# Patient Record
Sex: Male | Born: 1955 | Race: White | Hispanic: No | Marital: Married | Smoking: Never smoker
Health system: Southern US, Community
[De-identification: ages and names within clinical notes are randomized; demographics above are authoritative.]

## PROBLEM LIST (undated history)

## (undated) DIAGNOSIS — Q2381 Bicuspid aortic valve: Secondary | ICD-10-CM

## (undated) DIAGNOSIS — Q231 Congenital insufficiency of aortic valve: Secondary | ICD-10-CM

## (undated) DIAGNOSIS — N200 Calculus of kidney: Secondary | ICD-10-CM

## (undated) DIAGNOSIS — R7303 Prediabetes: Secondary | ICD-10-CM

## (undated) DIAGNOSIS — R319 Hematuria, unspecified: Secondary | ICD-10-CM

## (undated) DIAGNOSIS — R351 Nocturia: Secondary | ICD-10-CM

## (undated) DIAGNOSIS — I480 Paroxysmal atrial fibrillation: Secondary | ICD-10-CM

## (undated) DIAGNOSIS — Z973 Presence of spectacles and contact lenses: Secondary | ICD-10-CM

## (undated) DIAGNOSIS — E782 Mixed hyperlipidemia: Secondary | ICD-10-CM

## (undated) DIAGNOSIS — M1990 Unspecified osteoarthritis, unspecified site: Secondary | ICD-10-CM

## (undated) DIAGNOSIS — M5090 Cervical disc disorder, unspecified, unspecified cervical region: Secondary | ICD-10-CM

## (undated) DIAGNOSIS — N400 Enlarged prostate without lower urinary tract symptoms: Secondary | ICD-10-CM

## (undated) DIAGNOSIS — R339 Retention of urine, unspecified: Secondary | ICD-10-CM

## (undated) DIAGNOSIS — Z87442 Personal history of urinary calculi: Secondary | ICD-10-CM

## (undated) DIAGNOSIS — I10 Essential (primary) hypertension: Secondary | ICD-10-CM

## (undated) DIAGNOSIS — N210 Calculus in bladder: Secondary | ICD-10-CM

## (undated) HISTORY — DX: Cervical disc disorder, unspecified, unspecified cervical region: M50.90

## (undated) HISTORY — DX: Calculus of kidney: N20.0

## (undated) HISTORY — DX: Bicuspid aortic valve: Q23.81

## (undated) HISTORY — PX: INGUINAL HERNIA REPAIR: SUR1180

## (undated) HISTORY — DX: Congenital insufficiency of aortic valve: Q23.1

## (undated) HISTORY — PX: COLONOSCOPY: SHX174

---
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
## 2005-01-10 ENCOUNTER — Ambulatory Visit: Payer: Self-pay | Admitting: Family Medicine

## 2016-03-21 DIAGNOSIS — Z13220 Encounter for screening for lipoid disorders: Secondary | ICD-10-CM | POA: Insufficient documentation

## 2016-03-21 DIAGNOSIS — N200 Calculus of kidney: Secondary | ICD-10-CM | POA: Insufficient documentation

## 2016-03-21 DIAGNOSIS — R7303 Prediabetes: Secondary | ICD-10-CM | POA: Insufficient documentation

## 2016-03-21 DIAGNOSIS — Z0000 Encounter for general adult medical examination without abnormal findings: Secondary | ICD-10-CM

## 2016-03-21 HISTORY — DX: Prediabetes: R73.03

## 2016-03-21 HISTORY — DX: Encounter for screening for lipoid disorders: Z13.220

## 2016-03-21 HISTORY — DX: Encounter for general adult medical examination without abnormal findings: Z00.00

## 2018-03-25 DIAGNOSIS — R310 Gross hematuria: Secondary | ICD-10-CM

## 2018-03-25 HISTORY — DX: Gross hematuria: R31.0

## 2018-12-24 ENCOUNTER — Other Ambulatory Visit: Payer: Self-pay | Admitting: Urology

## 2019-01-11 ENCOUNTER — Other Ambulatory Visit (HOSPITAL_COMMUNITY)
Admission: RE | Admit: 2019-01-11 | Discharge: 2019-01-11 | Disposition: A | Discharge status: Home / Self Care | Payer: BC Managed Care – PPO | Source: Ambulatory Visit | Attending: Urology | Admitting: Urology

## 2019-01-11 DIAGNOSIS — Z20828 Contact with and (suspected) exposure to other viral communicable diseases: Secondary | ICD-10-CM | POA: Insufficient documentation

## 2019-01-11 DIAGNOSIS — Z01812 Encounter for preprocedural laboratory examination: Secondary | ICD-10-CM | POA: Diagnosis present

## 2019-01-12 LAB — NOVEL CORONAVIRUS, NAA (HOSP ORDER, SEND-OUT TO REF LAB; TAT 18-24 HRS): SARS-CoV-2, NAA: NOT DETECTED

## 2019-01-14 ENCOUNTER — Encounter (HOSPITAL_BASED_OUTPATIENT_CLINIC_OR_DEPARTMENT_OTHER): Payer: Self-pay | Admitting: *Deleted

## 2019-01-14 ENCOUNTER — Other Ambulatory Visit: Payer: Self-pay

## 2019-01-14 NOTE — Progress Notes (Addendum)
ADDENDUM:  Received via fax from [facility];  cxr 07-17-2013,  Chest CT 03-11-2014,  EKG tracing 07-17-2013 and 07-18-2013, ECHO 07-18-2013,  Nuclear stress test 07-19-2013   Spoke w/ via phone for pre-op interview--- PT Lab needs dos----  Istat 8 and EKG COVID test ------ 01-11-2019 Arrive at ------- 1000 NPO after ------ [place] Medications to take morning of surgery ----- NONE Diabetic medication ----- n/a Patient Special Instructions ----- reviewed [facility] guidelines and visitor restriction policy ,  Pt asked to bring home medication with him dos.  Per pt as was told by someone in dr Tresa Moore office the he be allowed to stay and discharge until 1500 so his wife can work and came pick him up.  Pt verbalized understanding that doctor's are told and pt armade aware that discharge is 0900 next day  due to other pt's needing to stay the night, we have four rooms, stated he would make arrangements Pre-Op special Istructions ----- n/a  Patient verbalized understanding of instructions that were given at this phone interview. Patient denies cardiac s&s shortness of breath, chest pain, fever, cough, and no peripheral swelling at this phone interview.   Anesthesia Review:  Hx paf 2017 (I requested stess test , echo, and any other cardiac work-up done @ [facility] via fax);  ADDENDUM:  Received records via fax the date is 04/ 2015 not 2017  PCP:  Dr Herbie Baltimore dough (lov 03-25-2018 epic)per pt pcp follow his AFib also Cardiologist : No Chest x-ray : EKG : 09-15-2016 care everywhere Echo :  Per pt done 2017 at [place] test:  Per pt done 2017 at [place] :  NO Sleep Study/ CPAP : NO Fasting Blood Sugar :     NO  Blood Thinner/ Instructions /Last Dose: NO ASA / Instructions/ Last Dose :  ASA 81mg /  Per pt given instructions by dr Tresa Moore office to stop week prior to surgery/  Last dose 01-09-2019

## 2019-01-15 ENCOUNTER — Ambulatory Visit (HOSPITAL_BASED_OUTPATIENT_CLINIC_OR_DEPARTMENT_OTHER)
Admission: RE | Admit: 2019-01-15 | Discharge: 2019-01-16 | Disposition: A | Discharge status: Home / Self Care | Payer: BC Managed Care – PPO | Attending: Urology | Admitting: Urology

## 2019-01-15 ENCOUNTER — Other Ambulatory Visit: Payer: Self-pay

## 2019-01-15 ENCOUNTER — Encounter (HOSPITAL_BASED_OUTPATIENT_CLINIC_OR_DEPARTMENT_OTHER)
Admission: RE | Disposition: A | Discharge status: Home / Self Care | Payer: Self-pay | Source: Home / Self Care | Attending: Urology

## 2019-01-15 ENCOUNTER — Ambulatory Visit (HOSPITAL_BASED_OUTPATIENT_CLINIC_OR_DEPARTMENT_OTHER): Payer: BC Managed Care – PPO | Admitting: Anesthesiology

## 2019-01-15 ENCOUNTER — Encounter (HOSPITAL_BASED_OUTPATIENT_CLINIC_OR_DEPARTMENT_OTHER): Payer: Self-pay | Admitting: *Deleted

## 2019-01-15 DIAGNOSIS — I480 Paroxysmal atrial fibrillation: Secondary | ICD-10-CM | POA: Diagnosis not present

## 2019-01-15 DIAGNOSIS — N401 Enlarged prostate with lower urinary tract symptoms: Secondary | ICD-10-CM | POA: Diagnosis not present

## 2019-01-15 DIAGNOSIS — R351 Nocturia: Secondary | ICD-10-CM | POA: Insufficient documentation

## 2019-01-15 DIAGNOSIS — M19072 Primary osteoarthritis, left ankle and foot: Secondary | ICD-10-CM | POA: Insufficient documentation

## 2019-01-15 DIAGNOSIS — N400 Enlarged prostate without lower urinary tract symptoms: Secondary | ICD-10-CM | POA: Diagnosis present

## 2019-01-15 DIAGNOSIS — Z87442 Personal history of urinary calculi: Secondary | ICD-10-CM | POA: Insufficient documentation

## 2019-01-15 DIAGNOSIS — M19071 Primary osteoarthritis, right ankle and foot: Secondary | ICD-10-CM | POA: Diagnosis not present

## 2019-01-15 DIAGNOSIS — Z87891 Personal history of nicotine dependence: Secondary | ICD-10-CM | POA: Diagnosis not present

## 2019-01-15 DIAGNOSIS — R319 Hematuria, unspecified: Secondary | ICD-10-CM | POA: Diagnosis not present

## 2019-01-15 DIAGNOSIS — N138 Other obstructive and reflux uropathy: Secondary | ICD-10-CM | POA: Insufficient documentation

## 2019-01-15 DIAGNOSIS — Z7982 Long term (current) use of aspirin: Secondary | ICD-10-CM | POA: Insufficient documentation

## 2019-01-15 DIAGNOSIS — Z886 Allergy status to analgesic agent status: Secondary | ICD-10-CM | POA: Insufficient documentation

## 2019-01-15 DIAGNOSIS — R7303 Prediabetes: Secondary | ICD-10-CM | POA: Diagnosis not present

## 2019-01-15 DIAGNOSIS — N210 Calculus in bladder: Secondary | ICD-10-CM | POA: Insufficient documentation

## 2019-01-15 DIAGNOSIS — M47812 Spondylosis without myelopathy or radiculopathy, cervical region: Secondary | ICD-10-CM | POA: Insufficient documentation

## 2019-01-15 DIAGNOSIS — E782 Mixed hyperlipidemia: Secondary | ICD-10-CM | POA: Insufficient documentation

## 2019-01-15 DIAGNOSIS — I10 Essential (primary) hypertension: Secondary | ICD-10-CM | POA: Diagnosis not present

## 2019-01-15 DIAGNOSIS — Z79899 Other long term (current) drug therapy: Secondary | ICD-10-CM | POA: Insufficient documentation

## 2019-01-15 HISTORY — DX: Presence of spectacles and contact lenses: Z97.3

## 2019-01-15 HISTORY — PX: TRANSURETHRAL RESECTION OF PROSTATE: SHX73

## 2019-01-15 HISTORY — DX: Hematuria, unspecified: R31.9

## 2019-01-15 HISTORY — DX: Essential (primary) hypertension: I10

## 2019-01-15 HISTORY — DX: Paroxysmal atrial fibrillation: I48.0

## 2019-01-15 HISTORY — DX: Calculus in bladder: N21.0

## 2019-01-15 HISTORY — PX: CYSTOSCOPY WITH LITHOLAPAXY: SHX1425

## 2019-01-15 HISTORY — DX: Unspecified osteoarthritis, unspecified site: M19.90

## 2019-01-15 HISTORY — DX: Nocturia: R35.1

## 2019-01-15 HISTORY — DX: Personal history of urinary calculi: Z87.442

## 2019-01-15 HISTORY — DX: Mixed hyperlipidemia: E78.2

## 2019-01-15 HISTORY — DX: Benign prostatic hyperplasia without lower urinary tract symptoms: N40.0

## 2019-01-15 LAB — POCT I-STAT, CHEM 8
BUN: 18 mg/dL (ref 8–23)
Calcium, Ion: 1.37 mmol/L (ref 1.15–1.40)
Chloride: 103 mmol/L (ref 98–111)
Creatinine, Ser: 0.9 mg/dL (ref 0.61–1.24)
Glucose, Bld: 100 mg/dL — ABNORMAL HIGH (ref 70–99)
HCT: 48 % (ref 39.0–52.0)
Hemoglobin: 16.3 g/dL (ref 13.0–17.0)
Potassium: 3.6 mmol/L (ref 3.5–5.1)
Sodium: 142 mmol/L (ref 135–145)
TCO2: 25 mmol/L (ref 22–32)

## 2019-01-15 SURGERY — TURP (TRANSURETHRAL RESECTION OF PROSTATE)
Anesthesia: General

## 2019-01-15 MED ORDER — SODIUM CHLORIDE 0.9 % IV SOLN
INTRAVENOUS | Status: DC
  Administered 2019-01-15: 14:00:00 via INTRAVENOUS
  Filled 2019-01-15 (×2): qty 1000

## 2019-01-15 MED ORDER — EPHEDRINE 5 MG/ML INJ
INTRAVENOUS | Status: AC
  Filled 2019-01-15: qty 10

## 2019-01-15 MED ORDER — DEXAMETHASONE SODIUM PHOSPHATE 10 MG/ML IJ SOLN
INTRAMUSCULAR | Status: DC | PRN
  Administered 2019-01-15: 10 mg via INTRAVENOUS

## 2019-01-15 MED ORDER — SODIUM CHLORIDE 0.9 % IR SOLN
3000.0000 mL | Status: DC
  Administered 2019-01-15 – 2019-01-16 (×6): 3000 mL
  Filled 2019-01-15: qty 3000

## 2019-01-15 MED ORDER — MIDAZOLAM HCL 2 MG/2ML IJ SOLN
INTRAMUSCULAR | Status: AC
  Filled 2019-01-15: qty 2

## 2019-01-15 MED ORDER — PROMETHAZINE HCL 25 MG/ML IJ SOLN
6.2500 mg | INTRAMUSCULAR | Status: DC | PRN
  Filled 2019-01-15: qty 1

## 2019-01-15 MED ORDER — DEXAMETHASONE SODIUM PHOSPHATE 10 MG/ML IJ SOLN
INTRAMUSCULAR | Status: AC
  Filled 2019-01-15: qty 1

## 2019-01-15 MED ORDER — SENNOSIDES-DOCUSATE SODIUM 8.6-50 MG PO TABS
1.0000 | ORAL_TABLET | Freq: Two times a day (BID) | ORAL | Status: DC
  Administered 2019-01-15 (×2): 1 via ORAL
  Filled 2019-01-15 (×2): qty 3
  Filled 2019-01-15: qty 1

## 2019-01-15 MED ORDER — ACETAMINOPHEN 500 MG PO TABS
ORAL_TABLET | ORAL | Status: AC
  Filled 2019-01-15: qty 2

## 2019-01-15 MED ORDER — LIDOCAINE 2% (20 MG/ML) 5 ML SYRINGE
INTRAMUSCULAR | Status: DC | PRN
  Administered 2019-01-15: 80 mg via INTRAVENOUS

## 2019-01-15 MED ORDER — LACTATED RINGERS IV SOLN
INTRAVENOUS | Status: DC
  Administered 2019-01-15: 10:00:00 via INTRAVENOUS
  Filled 2019-01-15: qty 1000

## 2019-01-15 MED ORDER — LIDOCAINE 2% (20 MG/ML) 5 ML SYRINGE
INTRAMUSCULAR | Status: AC
  Filled 2019-01-15: qty 5

## 2019-01-15 MED ORDER — GENTAMICIN SULFATE 40 MG/ML IJ SOLN
5.0000 mg/kg | INTRAVENOUS | Status: AC
  Administered 2019-01-15: 440 mg via INTRAVENOUS
  Filled 2019-01-15: qty 11

## 2019-01-15 MED ORDER — ACETAMINOPHEN 500 MG PO TABS
1000.0000 mg | ORAL_TABLET | Freq: Three times a day (TID) | ORAL | Status: AC
  Administered 2019-01-15 – 2019-01-16 (×3): 1000 mg via ORAL
  Filled 2019-01-15: qty 2

## 2019-01-15 MED ORDER — OXYBUTYNIN CHLORIDE ER 10 MG PO TB24
10.0000 mg | ORAL_TABLET | Freq: Every day | ORAL | Status: DC
  Administered 2019-01-15: 10 mg via ORAL
  Filled 2019-01-15: qty 1

## 2019-01-15 MED ORDER — INDAPAMIDE 1.25 MG PO TABS
1.2500 mg | ORAL_TABLET | Freq: Every day | ORAL | Status: DC
  Administered 2019-01-15: 1.25 mg via ORAL
  Filled 2019-01-15: qty 1

## 2019-01-15 MED ORDER — PROPOFOL 10 MG/ML IV BOLUS
INTRAVENOUS | Status: DC | PRN
  Administered 2019-01-15: 180 mg via INTRAVENOUS

## 2019-01-15 MED ORDER — FENTANYL CITRATE (PF) 100 MCG/2ML IJ SOLN
INTRAMUSCULAR | Status: AC
  Filled 2019-01-15: qty 2

## 2019-01-15 MED ORDER — OXYBUTYNIN CHLORIDE ER 10 MG PO TB24
ORAL_TABLET | ORAL | Status: AC
  Filled 2019-01-15: qty 1

## 2019-01-15 MED ORDER — ONDANSETRON HCL 4 MG/2ML IJ SOLN
INTRAMUSCULAR | Status: DC | PRN
  Administered 2019-01-15: 4 mg via INTRAVENOUS

## 2019-01-15 MED ORDER — HYDROMORPHONE HCL 1 MG/ML IJ SOLN
0.5000 mg | INTRAMUSCULAR | Status: DC | PRN
  Filled 2019-01-15: qty 1

## 2019-01-15 MED ORDER — ACETAMINOPHEN 10 MG/ML IV SOLN
1000.0000 mg | Freq: Once | INTRAVENOUS | Status: DC | PRN
  Filled 2019-01-15: qty 100

## 2019-01-15 MED ORDER — ATORVASTATIN CALCIUM 10 MG PO TABS
10.0000 mg | ORAL_TABLET | Freq: Every day | ORAL | Status: DC
  Administered 2019-01-15: 10 mg via ORAL
  Filled 2019-01-15: qty 1

## 2019-01-15 MED ORDER — SENNA 8.6 MG PO TABS
ORAL_TABLET | ORAL | Status: AC
  Filled 2019-01-15: qty 1

## 2019-01-15 MED ORDER — ONDANSETRON HCL 4 MG/2ML IJ SOLN
INTRAMUSCULAR | Status: AC
  Filled 2019-01-15: qty 2

## 2019-01-15 MED ORDER — MIDAZOLAM HCL 5 MG/5ML IJ SOLN
INTRAMUSCULAR | Status: DC | PRN
  Administered 2019-01-15: 2 mg via INTRAVENOUS

## 2019-01-15 MED ORDER — METOPROLOL SUCCINATE ER 50 MG PO TB24
50.0000 mg | ORAL_TABLET | Freq: Every day | ORAL | Status: DC
  Administered 2019-01-15: 50 mg via ORAL
  Filled 2019-01-15: qty 1

## 2019-01-15 MED ORDER — PROPOFOL 10 MG/ML IV BOLUS
INTRAVENOUS | Status: AC
  Filled 2019-01-15: qty 20

## 2019-01-15 MED ORDER — FENTANYL CITRATE (PF) 100 MCG/2ML IJ SOLN
INTRAMUSCULAR | Status: DC | PRN
  Administered 2019-01-15 (×2): 50 ug via INTRAVENOUS

## 2019-01-15 MED ORDER — EPHEDRINE SULFATE-NACL 50-0.9 MG/10ML-% IV SOSY
PREFILLED_SYRINGE | INTRAVENOUS | Status: DC | PRN
  Administered 2019-01-15 (×3): 10 mg via INTRAVENOUS

## 2019-01-15 MED ORDER — OXYCODONE HCL 5 MG PO TABS
5.0000 mg | ORAL_TABLET | ORAL | Status: DC | PRN
  Filled 2019-01-15: qty 1

## 2019-01-15 MED ORDER — FENTANYL CITRATE (PF) 100 MCG/2ML IJ SOLN
25.0000 ug | INTRAMUSCULAR | Status: DC | PRN
  Filled 2019-01-15: qty 1

## 2019-01-15 SURGICAL SUPPLY — 37 items
BAG DRAIN URO-CYSTO SKYTR STRL (DRAIN) ×3 IMPLANT
BAG DRN UROCATH (DRAIN) ×1
BAG URINE DRAINAGE (UROLOGICAL SUPPLIES) ×3 IMPLANT
BAG URINE LEG 500ML (DRAIN) IMPLANT
BASKET LASER NITINOL 1.9FR (BASKET) IMPLANT
BSKT STON RTRVL 120 1.9FR (BASKET)
CATH FOLEY 2WAY SLVR 30CC 20FR (CATHETERS) IMPLANT
CATH FOLEY 3WAY 30CC 24FR (CATHETERS) ×3
CATH INTERMIT  6FR 70CM (CATHETERS) IMPLANT
CATH URTH STD 24FR FL 3W 2 (CATHETERS) IMPLANT
CLOTH BEACON ORANGE TIMEOUT ST (SAFETY) ×3 IMPLANT
ELECT BIVAP BIPO 22/24 DONUT (ELECTROSURGICAL)
ELECT REM PT RETURN 9FT ADLT (ELECTROSURGICAL)
ELECTRD BIVAP BIPO 22/24 DONUT (ELECTROSURGICAL) IMPLANT
ELECTRODE REM PT RTRN 9FT ADLT (ELECTROSURGICAL) IMPLANT
FIBER LASER FLEXIVA 365 (UROLOGICAL SUPPLIES) IMPLANT
FIBER LASER TRAC TIP (UROLOGICAL SUPPLIES) IMPLANT
GLOVE BIO SURGEON STRL SZ7.5 (GLOVE) ×3 IMPLANT
GOWN STRL REUS W/ TWL LRG LVL3 (GOWN DISPOSABLE) ×1 IMPLANT
GOWN STRL REUS W/TWL LRG LVL3 (GOWN DISPOSABLE) ×6 IMPLANT
GUIDEWIRE ANG ZIPWIRE 038X150 (WIRE) ×3 IMPLANT
GUIDEWIRE STR DUAL SENSOR (WIRE) ×3 IMPLANT
HOLDER FOLEY CATH W/STRAP (MISCELLANEOUS) ×3 IMPLANT
IV NS 1000ML (IV SOLUTION) ×3
IV NS 1000ML BAXH (IV SOLUTION) ×1 IMPLANT
IV NS IRRIG 3000ML ARTHROMATIC (IV SOLUTION) ×18 IMPLANT
KIT TURNOVER CYSTO (KITS) ×3 IMPLANT
LOOP CUT BIPOLAR 24F LRG (ELECTROSURGICAL) IMPLANT
MANIFOLD NEPTUNE II (INSTRUMENTS) ×3 IMPLANT
NS IRRIG 500ML POUR BTL (IV SOLUTION) ×6 IMPLANT
PACK CYSTO (CUSTOM PROCEDURE TRAY) ×3 IMPLANT
SYR 10ML LL (SYRINGE) ×3 IMPLANT
SYRINGE IRR TOOMEY STRL 70CC (SYRINGE) ×3 IMPLANT
TUBE CONNECTING 12'X1/4 (SUCTIONS) ×1
TUBE CONNECTING 12X1/4 (SUCTIONS) ×2 IMPLANT
TUBE FEEDING 8FR 16IN STR KANG (MISCELLANEOUS) IMPLANT
TUBING UROLOGY SET (TUBING) ×3 IMPLANT

## 2019-01-15 NOTE — Brief Op Note (Signed)
01/15/2019  1:00 PM  PATIENT:  Irving Copas  63 y.o. male  PRE-OPERATIVE DIAGNOSIS:  LARGE PROSTATE WITH BLADDER STONE  POST-OPERATIVE DIAGNOSIS:  LARGE PROSTATE WITH BLADDER STONE  PROCEDURE:  Procedure(s): TRANSURETHRAL RESECTION OF THE PROSTATE (TURP) (N/A) CYSTOSCOPY WITH LITHOLAPAXY (N/A)  SURGEON:  Surgeon(s) and Role:    * Alexis Frock, MD - Primary  PHYSICIAN ASSISTANT:   ASSISTANTS: none   ANESTHESIA:   general  EBL:  171mL   BLOOD ADMINISTERED:none  DRAINS: 3 way foley to NS irrigation   LOCAL MEDICATIONS USED:  NONE  SPECIMEN:  Source of Specimen:  1 - bladder stone fragments given to pt; 2 - prostate chips for permanant pathology  DISPOSITION OF SPECIMEN:  as per above  COUNTS:  YES  TOURNIQUET:  * No tourniquets in log *  DICTATION: .Other Dictation: Dictation Number [id number]  PLAN OF CARE: Admit for overnight observation  PATIENT DISPOSITION:  PACU - hemodynamically stable.   Delay start of Pharmacological VTE agent (>24hrs) due to surgical blood loss or risk of bleeding: yes

## 2019-01-15 NOTE — H&P (Signed)
[name] is an 63 y.o. male.    Chief Complaint: Pre-OP TURP + Cystolithalopexy  HPI:   1 - Gross Hematuria - long h/o occoasional hematuira, especially with activity. Long time stone former. Had cysto x 2 previously that was unremarkable per report. Non- smoker. No chronic solvent exposure. CT 05/2018 with multifocal stones.   2 - Urolithiasis - many episodes of medical passage of stones, up to 1mm. NO prior surgeries.  06/2018 - CT - punctate left renl stone, multifocal bladder stones   3  Enlarged Prostate with Bladder Stones - 56mL prostate with some median lobe by CT 2020 with about 3cm volume bladder stones on eval gross hematuria. PVR <33mL 2020.  PMH sig for pre-DM (A1c 6), AFib (transies, no blood thinners), Rt open inguinal hernia repair. 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 His PCP is Teressa Lower MD with [facility] [address] in [place].   Today " [name] " is seen to proceed with TURP and cystolithalopexy for very large prostate with bladder stones. C19 screen negative. Most recent UCX negative.    Past Medical History:  Diagnosis Date  . Arthritis    neck , ankles  . Bladder stone   . BPH (benign prostatic hyperplasia)   . Hematuria   . History of kidney stones   . Hypertension   . Mixed hyperlipidemia   . Nocturia   . PAF (paroxysmal atrial fibrillation) ([place]) followed by pcp-- per pt never followed by cardiologist after 2017   per pt had AFib 2017,  had work-up stress test and echo done at [facility] at that time, no other work-up done since,   . Pre-diabetes   . Wears glasses     Past Surgical History:  Procedure Laterality Date  . CYSTOSTOMY W/ RETROGRADE URETEROSCOPY  1994  . INGUINAL HERNIA REPAIR Right 2006  approx.    History reviewed. No pertinent family history. Social History:  reports that he has never smoked. He quit smokeless tobacco use about 45 years ago.  His smokeless tobacco use included snuff and  chew. He reports previous alcohol use. He reports that he does not use drugs.  Allergies:  Allergies  Allergen Reactions  . Ibuprofen Other (See Comments)    Mouth sores    No medications prior to admission.    No results found for this or any previous visit (from the past 48 hour(s)). No results found.  Review of Systems  Constitutional: Negative.   HENT: Negative.   Eyes: Negative.   Respiratory: Negative.   Cardiovascular: Negative.   Gastrointestinal: Negative.   Genitourinary: Positive for frequency, hematuria and urgency.  Musculoskeletal: Negative.   Skin: Negative.   Neurological: Negative.   Endo/Heme/Allergies: Negative.   Psychiatric/Behavioral: Negative.     Height 6\' 4"  (1.93 m), weight 88.5 kg. Physical Exam  Constitutional: He appears well-developed.  HENT:  Head: Normocephalic.  Eyes: Pupils are equal, round, and reactive to light.  Neck: Normal range of motion.  Cardiovascular: Normal rate.  Respiratory: Effort normal.  GI: Soft.  Genitourinary:    Genitourinary Comments: No CVAT.    Musculoskeletal: Normal range of motion.  Neurological: He is alert.  Skin: Skin is warm.  Psychiatric: He has a normal mood and affect.     Assessment/Plan  Proceed as planned with TURP / Cystolithalopexy. Risks, benefits, alternatives, expected peri-op couse discussed previously and reiterated today.   Alexis Frock, MD 01/15/2019, 8:15 AM

## 2019-01-15 NOTE — Anesthesia Procedure Notes (Signed)
Procedure Name: LMA Insertion Date/Time: 01/15/2019 11:52 AM Performed by: Stellarose Cerny D, CRNA Pre-anesthesia Checklist: Patient identified, Emergency Drugs available, Suction available and Patient being monitored Patient Re-evaluated:Patient Re-evaluated prior to induction Oxygen Delivery Method: Circle system utilized Preoxygenation: Pre-oxygenation with 100% oxygen Induction Type: IV induction Ventilation: Mask ventilation without difficulty LMA: LMA inserted LMA Size: 5.0 Tube type: Oral Number of attempts: 1 Placement Confirmation: positive ETCO2 and breath sounds checked- equal and bilateral Tube secured with: Tape Dental Injury: Teeth and Oropharynx as per pre-operative assessment

## 2019-01-15 NOTE — Transfer of Care (Signed)
Immediate Anesthesia Transfer of Care Note  Patient: [name]  Procedure(s) Performed: TRANSURETHRAL RESECTION OF THE PROSTATE (TURP) (N/A ) CYSTOSCOPY WITH LITHOLAPAXY (N/A )  Patient Location: PACU  Anesthesia Type:General  Level of Consciousness: awake, alert  and oriented  Airway & Oxygen Therapy: Patient Spontanous Breathing and Patient connected to nasal cannula oxygen  Post-op Assessment: Report given to RN and Post -op Vital signs reviewed and stable  Post vital signs: Reviewed and stable  Last Vitals:  Vitals Value Taken Time  BP 129/92 01/15/19 1310  Temp    Pulse 80 01/15/19 1312  Resp 13 01/15/19 1312  SpO2 99 % 01/15/19 1312  Vitals shown include unvalidated device data.  Last Pain:  Vitals:   01/15/19 1031  TempSrc: Oral  PainSc: 0-No pain      Patients Stated Pain Goal: 4 (16/94/50 3888)  Complications: No apparent anesthesia complications

## 2019-01-15 NOTE — Anesthesia Preprocedure Evaluation (Signed)
Anesthesia Evaluation  Patient identified by MRN, date of birth, ID band Patient awake    Reviewed: Allergy & Precautions, NPO status , Patient's Chart, lab work & pertinent test results  Airway Mallampati: II  TM Distance: >3 FB Neck ROM: Full    Dental no notable dental hx.    Pulmonary neg pulmonary ROS,    Pulmonary exam normal breath sounds clear to auscultation       Cardiovascular hypertension, Normal cardiovascular exam+ dysrhythmias Atrial Fibrillation  Rhythm:Regular Rate:Normal  PAF 2017   Neuro/Psych negative neurological ROS  negative psych ROS   GI/Hepatic negative GI ROS, Neg liver ROS,   Endo/Other  negative endocrine ROS  Renal/GU negative Renal ROS  negative genitourinary   Musculoskeletal negative musculoskeletal ROS (+)   Abdominal   Peds negative pediatric ROS (+)  Hematology negative hematology ROS (+)   Anesthesia Other Findings   Reproductive/Obstetrics negative OB ROS                            Anesthesia Physical Anesthesia Plan  ASA: III  Anesthesia Plan: General   Post-op Pain Management:    Induction: Intravenous  PONV Risk Score and Plan: 2 and Ondansetron, Dexamethasone and Treatment may vary due to age or medical condition  Airway Management Planned: LMA  Additional Equipment:   Intra-op Plan:   Post-operative Plan: Extubation in OR  Informed Consent: I have reviewed the patients History and Physical, chart, labs and discussed the procedure including the risks, benefits and alternatives for the proposed anesthesia with the patient or authorized representative who has indicated his/her understanding and acceptance.     Dental advisory given  Plan Discussed with: CRNA and Surgeon  Anesthesia Plan Comments:         Anesthesia Quick Evaluation

## 2019-01-15 NOTE — Anesthesia Postprocedure Evaluation (Signed)
Anesthesia Post Note  Patient: [name]  Procedure(s) Performed: TRANSURETHRAL RESECTION OF THE PROSTATE (TURP) (N/A ) CYSTOSCOPY WITH LITHOLAPAXY (N/A )     Patient location during evaluation: PACU Anesthesia Type: General Level of consciousness: awake and alert Pain management: pain level controlled Vital Signs Assessment: post-procedure vital signs reviewed and stable Respiratory status: spontaneous breathing, nonlabored ventilation, respiratory function stable and patient connected to nasal cannula oxygen Cardiovascular status: blood pressure returned to baseline and stable Postop Assessment: no apparent nausea or vomiting Anesthetic complications: no    Last Vitals:  Vitals:   01/15/19 1400 01/15/19 1418  BP: 114/83 126/83  Pulse: 61 62  Resp: 14   Temp:  (!) 36.3 C  SpO2: 97% 97%    Last Pain:  Vitals:   01/15/19 1418  TempSrc:   PainSc: 0-No pain                 Dulcie Gammon S

## 2019-01-16 ENCOUNTER — Encounter (HOSPITAL_BASED_OUTPATIENT_CLINIC_OR_DEPARTMENT_OTHER): Payer: Self-pay | Admitting: Urology

## 2019-01-16 DIAGNOSIS — N210 Calculus in bladder: Secondary | ICD-10-CM | POA: Diagnosis not present

## 2019-01-16 HISTORY — PX: OTHER SURGICAL HISTORY: SHX169

## 2019-01-16 LAB — BASIC METABOLIC PANEL
Anion gap: 6 (ref 5–15)
BUN: 16 mg/dL (ref 8–23)
CO2: 24 mmol/L (ref 22–32)
Calcium: 8.8 mg/dL — ABNORMAL LOW (ref 8.9–10.3)
Chloride: 106 mmol/L (ref 98–111)
Creatinine, Ser: 0.85 mg/dL (ref 0.61–1.24)
GFR calc Af Amer: >60 mL/min (ref >60)
GFR calc non Af Amer: >60 mL/min (ref >60)
Glucose, Bld: 137 mg/dL — ABNORMAL HIGH (ref 70–99)
Potassium: 4 mmol/L (ref 3.5–5.1)
Sodium: 136 mmol/L (ref 135–145)

## 2019-01-16 LAB — SURGICAL PATHOLOGY

## 2019-01-16 LAB — HEMOGLOBIN AND HEMATOCRIT, BLOOD
HCT: 42.9 % (ref 39.0–52.0)
Hemoglobin: 14.1 g/dL (ref 13.0–17.0)

## 2019-01-16 MED ORDER — SENNOSIDES-DOCUSATE SODIUM 8.6-50 MG PO TABS: 1.0000 | ORAL_TABLET | Freq: Two times a day (BID) | ORAL | 0 refills | Status: DC

## 2019-01-16 MED ORDER — SULFAMETHOXAZOLE-TRIMETHOPRIM 800-160 MG PO TABS: 1.0000 | ORAL_TABLET | Freq: Two times a day (BID) | ORAL | 0 refills | Status: DC

## 2019-01-16 MED ORDER — ACETAMINOPHEN 500 MG PO TABS
ORAL_TABLET | ORAL | Status: AC
  Filled 2019-01-16: qty 2

## 2019-01-16 MED ORDER — TRAMADOL HCL 50 MG PO TABS: 50.0000 mg | ORAL_TABLET | Freq: Four times a day (QID) | ORAL | 0 refills | Status: DC | PRN

## 2019-01-16 NOTE — Op Note (Signed)
[name], [name] MEDICAL RECORD [id number] ACCOUNT [id number] DATE OF BIRTH:[birth year] FACILITY: [facility] LOCATION: [facility] PHYSICIAN:Emillio Ngo, MD  OPERATIVE REPORT  DATE OF PROCEDURE:  01/15/2019  PREOPERATIVE DIAGNOSES:   1.  Very large prostate with bladder stones. 2.  Refractory irritative and obstructive voiding.  PROCEDURE:   1.  Cystolitholapaxy, stone greater than 2 cm. 2.  Transurehral REsection of the prostate.  SURGEON:  Alexis Frock, MD   ESTIMATED BLOOD LOSS:  035 mL  COMPLICATIONS:  None.  SPECIMENS:   1.  Bladder stone fragments to be given to the patient. 2.  Prostate chips for permanent pathology.  FINDINGS: 1.  Approximately 2 cm volume bladder stone across 4 individual small stones. 2.  Complete resolution of all accessible bladder stones following cystolitholapaxy. 3.  Bilobar prostatic hypertrophy. 4.  Wide open prostate channel from the verumontanum to the bladder neck following transurethral resection.  DRAINS:  A 24-French 3-way Foley catheter to normal saline irrigation, efflux pink, 30 mL were in the balloon.  INDICATIONS:  The patient is a very pleasant 63 year old man who was found on workup of refractory irritative and obstructive voiding to have a very large prostate, as well as multifocal bladder stones.  He is able to empty reasonably well, verifying  corroborated preserved detrusor function.  Options were discussed for management.  His prostate is less than 100 g, including recommended path of cystolitholapaxy and transurethral resection of prostate being the most definitive and he wished to proceed.   Informed consent was then placed in medical record.  DESCRIPTION OF PROCEDURE:  The patient being the patient verified, the procedure being cystolitholapaxy and transurethral prostate was confirmed.  A procedure timeout was performed.  Intravenous antibiotics administered.  General anesthesia was induced.   The patient was  placed into a low lithotomy position.  A sterile field was created by prepping and draping his penis, perineum and proximal thighs using iodine.  Cystourethroscopy was performed using a 26-French resectoscope sheath with visual  obturator.  Inspection of the anterior and posterior urethra revealed large bilobar prostatic hypertrophy with some intravesical protrusion.  This was fairly symmetric.  Inspection of bladder revealed a moderately trabeculated bladder.  Ureteral orifices  were singleton bilaterally.  There were multifocal bladder stones, 3 dominant larger stones approximately a centimeter each and 1 smaller stone approximately 0.5 cm.  Photodocumentation performed.  Next, using the lithotrite direct visualization stone  crushing device, these stones were very carefully fragmented into pieces approximately 2-3 mm, which were then irrigated via the cystoscope sheath, set aside to be given to patient.  Following this, with resolution of all bladder stones, no evidence of  any bladder perforation with panendoscopic examination of the bladder, this was clearly felt that it would be safe to proceed with the prostate resection portion.  As such, the resectoscope loop bipolar type was used with normal saline irrigation and  transurethral resection was performed in a top-down orientation from the area of the bladder neck to the verumontanum, taking exquisite care to avoid undermining of the bladder neck or resection distal to the verumontanum.  Resection was carried down to  superficial fibromuscular stroma of the prostate capsule, again first of the superior aspect, then of the right lobe, then of the left lobe.  Prostate chips were irrigated and set aside from pathology and visualization revealed complete resolution of all  accessible chips.  The base of the resection area was fulgurated with bipolar current.  The resectoscope was then exchanged for a  new 24-French 3-way Foley catheter, 30 mL sterile water  in the balloon.  This was exchanged over a Sensor-type wire to  maximally avoid trauma to the bladder neck.  This was connected to normal saline irrigation, the efflux medium pink and no clots.  The procedure terminated.  The patient tolerated the procedure well.  No immediate complications.  The patient was taken to  the postanesthesia care unit in stable condition with plan for observation and likely discharge in the morning before a 24-hour stay.  VN/NUANCE  D:01/15/2019 T:01/15/2019 JOB:[id number]

## 2019-01-16 NOTE — Discharge Summary (Signed)
Physician Discharge Summary  Patient ID: RAYCE BRAHMBHATT MRN: [id number] DOB/AGE: 05/12/1955 63 y.o.  Admit date: 01/15/2019 Discharge date: 01/16/2019  Admission Diagnoses: Prostatic Hypertrophy, Bladder Stones  Discharge Diagnoses:  Active Problems:   Prostatic hyperplasia   Discharged Condition: good  Hospital Course: Pt underwent transurethral resection of prostate and cystolithalopexy on 01/15/19, the day of admission, without acute complication. He was observed overnight on gentle bladder irrigation that was slowly weaned to off. By the AM of 10/29, he is ambulatory, pain controlled on PO meds, maintaining PO nutrition, and felt to be adequate for discharge. Hgb 14.1. Prostate pathology pending at discharge.   Consults: None  Significant Diagnostic Studies: labs: as per above  Treatments: surgery: as per above  Discharge Exam: Blood pressure 109/73, pulse (!) 52, temperature 98 F (36.7 C), resp. rate 16, height 6\' 4"  (1.93 m), weight 85 kg, SpO2 97 %. General appearance: alert, cooperative, appears stated age and very pleasant, at baseline.  Eyes: negative Nose: Nares normal. Septum midline. Mucosa normal. No drainage or sinus tenderness. Throat: lips, mucosa, and tongue normal; teeth and gums normal Neck: supple, symmetrical, trachea midline Back: symmetric, no curvature. ROM normal. No CVA tenderness. Resp: non-labored on room air.  Cardio: Nl rate GI: soft, non-tender; bowel sounds normal; no masses,  no organomegaly Male genitalia: normal, 3 way catheter in place with light pink urine off irrigation.  Extremities: extremities normal, atraumatic, no cyanosis or edema Pulses: 2+ and symmetric Skin: Skin color, texture, turgor normal. No rashes or lesions Lymph nodes: Cervical, supraclavicular, and axillary nodes normal. Neurologic: Grossly normal  Disposition: Discharge disposition: 01-Home or Self Care        Allergies as of 01/16/2019      Reactions    Ibuprofen Other (See Comments)   Mouth sores      Medication List    STOP taking these medications   acetaminophen 500 MG tablet Commonly known as: TYLENOL   aspirin EC 81 MG tablet   naproxen sodium 220 MG tablet Commonly known as: ALEVE     TAKE these medications   atorvastatin 10 MG tablet Commonly known as: LIPITOR Take 10 mg by mouth daily.   indapamide 1.25 MG tablet Commonly known as: LOZOL Take 1.25 mg by mouth daily.   metoprolol succinate 50 MG 24 hr tablet Commonly known as: TOPROL-XL Take 50 mg by mouth daily. Take with or immediately following a meal.   senna-docusate 8.6-50 MG tablet Commonly known as: Senokot-S Take 1 tablet by mouth 2 (two) times daily. While taking stronger pain meds to prevent constipation.   sulfamethoxazole-trimethoprim 800-160 MG tablet Commonly known as: BACTRIM DS Take 1 tablet by mouth 2 (two) times daily. X 3 days. Begin day before next Urology appointment.   traMADol 50 MG tablet Commonly known as: Ultram Take 1-2 tablets (50-100 mg total) by mouth every 6 (six) hours as needed for moderate pain or severe pain (post-operatively).      Follow-up Information    Alexis Frock, MD On 01/20/2019.   Specialty: Urology Why: at 7:45 for MD visit and office catheter removal.  Contact information: [place] [address] [phone]           Signed: Alexis Frock 01/16/2019, 6:55 AM

## 2019-01-16 NOTE — Discharge Instructions (Signed)
Transurethral Resection of the Prostate, Care After °This sheet gives you information about how to care for yourself after your procedure. Your health care provider may also give you more specific instructions. If you have problems or questions, contact your health care provider. °What can I expect after the procedure? °After the procedure, it is common to have: °· Mild pain in your lower abdomen. °· Soreness or mild discomfort in your penis from having the catheter inserted during the procedure. °· A feeling of urgency when you need to urinate. °· A small amount of blood in your urine. You may notice some small blood clots in your urine. These are normal. °Follow these instructions at home: °Medicines °· Take over-the-counter and prescription medicines only as told by your health care provider. °· If you were prescribed an antibiotic medicine, take it as told by your health care provider. Do not stop taking the antibiotic even if you start to feel better. °· Ask your health care provider if the medicine prescribed to you: °? Requires you to avoid driving or using heavy machinery. °? Can cause constipation. You may need to take actions to prevent or treat constipation, such as: °§ Take over-the-counter or prescription medicines. °§ Eat foods that are high in fiber, such as fresh fruits and vegetables, whole grains, and beans. °§ Limit foods that are high in fat and processed sugars, such as fried or sweet foods. °· Do not drive for 24 hours if you were given a sedative during your procedure. °Activity ° °· Return to your normal activities as told by your health care provider. Ask your health care provider what activities are safe for you. °· Do not lift anything that is heavier than 10 lb (4.5 kg), or the limit that you are told, for 3 weeks after the procedure or until your health care provider says that it is safe. °· Avoid intense physical activity for as long as told by your health care provider. °· Avoid  sitting for a long time without moving. Get up and move around one or more times every few hours. This helps to prevent blood clots. You may increase your physical activity gradually as you start to feel better. °Lifestyle °· Do not drink alcohol for as long as told by your health care provider. This is especially important if you are taking prescription pain medicines. °· Do not engage in sexual activity until your health care provider says that you can do this. °General instructions ° °· Do not take baths, swim, or use a hot tub until your health care provider approves. °· Drink enough fluid to keep your urine pale yellow. °· Urinate as soon as you feel the need to. Do not try to hold your urine for long periods of time. °· If your health care provider approves, you may take a stool softener for 2-3 weeks to prevent you from straining to have a bowel movement. °· Wear compression stockings as told by your health care provider. These stockings help to prevent blood clots and reduce swelling in your legs. °· Keep all follow-up visits as told by your health care provider. This is important. °Contact a health care provider if you have: °· Difficulty urinating. °· A fever. °· Pain that gets worse or does not improve with medicine. °· Blood in your urine that does not go away after 1 week of resting and drinking more fluids. °· Swelling in your penis or testicles. °Get help right away if: °· You are unable   to urinate.  You are having more blood clots in your urine instead of fewer.  You have: ? Large blood clots. ? A lot of blood in your urine. ? Pain in your back or lower abdomen. ? Pain or swelling in your legs. ? Chills and you are shaking. ? Difficulty breathing or shortness of breath. Summary  After the procedure, it is common to have a small amount of blood in your urine.  Avoid heavy lifting and intense physical activity for as long as told by your health care provider.  Urinate as soon as you  feel the need to. Do not try to hold your urine for long periods of time.  Keep all follow-up visits as told by your health care provider. This is important. This information is not intended to replace advice given to you by your health care provider. Make sure you discuss any questions you have with your health care provider. Document Released: 03/06/2005 Document Revised: 06/26/2018 Document Reviewed: 12/05/2017 Elsevier Patient Education  2020 [place] may have urinary urgency (bladder spasms) and bloody urine on / off for up to 3 weeks. This is normal.  2 - Call MD or go to ER for fever >102, severe pain / nausea / vomiting not relieved by medications, or acute change in medical status

## 2019-01-20 ENCOUNTER — Emergency Department (HOSPITAL_COMMUNITY)
Admission: EM | Admit: 2019-01-20 | Discharge: 2019-01-20 | Disposition: A | Discharge status: Home / Self Care | Payer: BC Managed Care – PPO | Attending: Emergency Medicine | Admitting: Emergency Medicine

## 2019-01-20 ENCOUNTER — Other Ambulatory Visit: Payer: Self-pay

## 2019-01-20 ENCOUNTER — Encounter (HOSPITAL_COMMUNITY): Payer: Self-pay

## 2019-01-20 DIAGNOSIS — R339 Retention of urine, unspecified: Secondary | ICD-10-CM | POA: Diagnosis present

## 2019-01-20 DIAGNOSIS — Z87891 Personal history of nicotine dependence: Secondary | ICD-10-CM | POA: Diagnosis not present

## 2019-01-20 DIAGNOSIS — R1030 Lower abdominal pain, unspecified: Secondary | ICD-10-CM | POA: Insufficient documentation

## 2019-01-20 DIAGNOSIS — I10 Essential (primary) hypertension: Secondary | ICD-10-CM | POA: Diagnosis not present

## 2019-01-20 DIAGNOSIS — Z79899 Other long term (current) drug therapy: Secondary | ICD-10-CM | POA: Insufficient documentation

## 2019-01-20 MED ORDER — LIDOCAINE HCL URETHRAL/MUCOSAL 2 % EX GEL
CUTANEOUS | Status: AC
  Filled 2019-01-20: qty 30

## 2019-01-20 MED ORDER — LIDOCAINE HCL URETHRAL/MUCOSAL 2 % EX GEL: 1.0000 "application " | Freq: Once | CUTANEOUS | Status: DC | PRN

## 2019-01-20 NOTE — ED Triage Notes (Signed)
Pt has his catheter removed this am after having a TURP last week, he states he urinated at the time but hasn't urinated since 830 am

## 2019-01-20 NOTE — ED Notes (Signed)
Pt verbalized discharge instructions and follow up care. Alert and ambulatory. No IV. Given leg bag for home use.

## 2019-01-20 NOTE — ED Provider Notes (Signed)
[facility]-EMERGENCY DEPT Provider Note   CSN: [id number] Arrival date & time: 01/20/19  1951     History   Chief Complaint Chief Complaint  Patient presents with  . Urinary Retention    HPI [name] is a 63 y.o. male.     Patient states that he had his Foley removed today at the urologist office.  He has not urinated since 830 this morning.  Patient complains of severe abdominal discomfort  The history is provided by the patient. No language interpreter was used.  Abdominal Pain Pain location:  Suprapubic Pain quality: aching   Pain radiates to:  Does not radiate Pain severity:  Moderate Onset quality:  Sudden Timing:  Constant Progression:  Worsening Chronicity:  Recurrent Context: not alcohol use   Associated symptoms: no chest pain, no cough, no diarrhea, no fatigue and no hematuria     Past Medical History:  Diagnosis Date  . Arthritis    neck , ankles  . Bladder stone   . BPH (benign prostatic hyperplasia)   . Hematuria   . History of kidney stones   . Hypertension   . Mixed hyperlipidemia   . Nocturia   . PAF (paroxysmal atrial fibrillation) (HCC) followed by pcp-- per pt never followed by cardiologist after 2017   per pt had AFib 2017,  had work-up stress test and echo done at [facility] at that time, no other work-up done since,   . Wears glasses     Patient Active Problem List   Diagnosis Date Noted  . Prostatic hyperplasia 01/15/2019    Past Surgical History:  Procedure Laterality Date  . CYSTOSCOPY WITH LITHOLAPAXY N/A 01/15/2019   Procedure: CYSTOSCOPY WITH LITHOLAPAXY;  Surgeon: Sebastian Ache, MD;  Location: [facility];  Service: Urology;  Laterality: N/A;  . CYSTOSTOMY W/ RETROGRADE URETEROSCOPY  1994  . INGUINAL HERNIA REPAIR Right 2006  approx.  . TRANSURETHRAL RESECTION OF PROSTATE N/A 01/15/2019   Procedure: TRANSURETHRAL RESECTION OF THE PROSTATE (TURP);  Surgeon: Sebastian Ache, MD;  Location: [facility];  Service: Urology;  Laterality: N/A;        Home Medications    Prior to Admission medications   Medication Sig Start Date End Date Taking? Authorizing Provider  atorvastatin (LIPITOR) 10 MG tablet Take 10 mg by mouth daily.    [provider]  indapamide (LOZOL) 1.25 MG tablet Take 1.25 mg by mouth daily.    [provider]  metoprolol succinate (TOPROL-XL) 50 MG 24 hr tablet Take 50 mg by mouth daily. Take with or immediately following a meal.    [provider]  senna-docusate (SENOKOT-S) 8.6-50 MG tablet Take 1 tablet by mouth 2 (two) times daily. While taking stronger pain meds to prevent constipation. 01/16/19   Sebastian Ache, MD  sulfamethoxazole-trimethoprim (BACTRIM DS) 800-160 MG tablet Take 1 tablet by mouth 2 (two) times daily. X 3 days. Begin day before next Urology appointment. 01/16/19   Sebastian Ache, MD  traMADol (ULTRAM) 50 MG tablet Take 1-2 tablets (50-100 mg total) by mouth every 6 (six) hours as needed for moderate pain or severe pain (post-operatively). 01/16/19   Sebastian Ache, MD    Family History History reviewed. No pertinent family history.  Social History Social History   Tobacco Use  . Smoking status: Never Smoker  . Smokeless tobacco: Former Neurosurgeon    Types: Snuff, Chew  Substance Use Topics  . Alcohol use: Not Currently  . Drug  use: Never     Allergies   Ibuprofen   Review of Systems Review of Systems  Constitutional: Negative for appetite change and fatigue.  HENT: Negative for congestion, ear discharge and sinus pressure.   Eyes: Negative for discharge.  Respiratory: Negative for cough.   Cardiovascular: Negative for chest pain.  Gastrointestinal: Positive for abdominal pain. Negative for diarrhea.  Genitourinary: Negative for frequency and hematuria.  Musculoskeletal: Negative for back pain.  Skin: Negative for rash.  Neurological: Negative for  seizures and headaches.  Psychiatric/Behavioral: Negative for hallucinations.     Physical Exam Updated Vital Signs BP 132/77 (BP Location: Right Arm)   Pulse 82   Temp 98.2 F (36.8 C) (Oral)   Resp 15   Ht 6\' 4"  (1.93 m)   Wt 87.1 kg   SpO2 95%   BMI 23.37 kg/m   Physical Exam Vitals signs and nursing note reviewed.  Constitutional:      Appearance: He is well-developed.  HENT:     Head: Normocephalic.     Nose: Nose normal.  Eyes:     General: No scleral icterus.    Conjunctiva/sclera: Conjunctivae normal.  Neck:     Musculoskeletal: Neck supple.     Thyroid: No thyromegaly.  Cardiovascular:     Rate and Rhythm: Normal rate and regular rhythm.     Heart sounds: No murmur. No friction rub. No gallop.   Pulmonary:     Breath sounds: No stridor. No wheezing or rales.  Chest:     Chest wall: No tenderness.  Abdominal:     General: There is distension.     Tenderness: There is abdominal tenderness. There is no rebound.  Musculoskeletal: Normal range of motion.  Lymphadenopathy:     Cervical: No cervical adenopathy.  Skin:    Findings: No erythema or rash.  Neurological:     Mental Status: He is oriented to person, place, and time.     Motor: No abnormal muscle tone.     Coordination: Coordination normal.  Psychiatric:        Behavior: Behavior normal.      ED Treatments / Results  Labs (all labs ordered are listed, but only abnormal results are displayed) Labs Reviewed - No data to display  EKG None  Radiology No results found.  Procedures Procedures (including critical care time)  Medications Ordered in ED Medications  lidocaine (XYLOCAINE) 2 % jelly 1 application (has no administration in time range)  lidocaine (XYLOCAINE) 2 % jelly (has no administration in time range)     Initial Impression / Assessment and Plan / ED Course  I have reviewed the triage vital signs and the nursing notes.  Pertinent labs & imaging results that were  available during my care of the patient were reviewed by me and considered in my medical decision making (see chart for details).        Patient with urinary retention.  Patient had no Foley placed.  Patient felt much better and there was significant urine in the Foley bag.  He will follow-up with urology  Final Clinical Impressions(s) / ED Diagnoses   Final diagnoses:  Urinary retention    ED Discharge Orders    None       Milton Ferguson, MD 01/20/19 2152

## 2019-01-20 NOTE — Discharge Instructions (Addendum)
Follow up with urology this week.

## 2019-03-04 DIAGNOSIS — N3001 Acute cystitis with hematuria: Secondary | ICD-10-CM | POA: Insufficient documentation

## 2019-03-04 HISTORY — DX: Acute cystitis with hematuria: N30.01

## 2019-04-24 ENCOUNTER — Other Ambulatory Visit: Payer: Self-pay | Admitting: Urology

## 2019-04-25 ENCOUNTER — Other Ambulatory Visit: Payer: Self-pay

## 2019-04-25 ENCOUNTER — Encounter (HOSPITAL_BASED_OUTPATIENT_CLINIC_OR_DEPARTMENT_OTHER): Payer: Self-pay | Admitting: Urology

## 2019-04-25 NOTE — Progress Notes (Signed)
Spoke w/ via phone for pre-op interview---[name] needs dos----Istat 8              Hayes results------ COVID test ------04/30/2019 Pre patient's request due to work schedule Arrive at -------1300 NPO after ------After midnight, clear liquids until 9:00 AM Medications to take morning of surgery -----Atorvastatin, Metoprolol Diabetic medication -----N/A Patient Special Instructions -----N/A Pre-Op special Istructions -----N/A  Patient verbalized understanding of instructions that were given at this phone interview. Patient denies shortness of breath, chest pain, fever, cough a this phone interview.

## 2019-04-25 NOTE — Progress Notes (Signed)
Anesthesia Review:N/A  PCP: Dr. Lorenso Courier Cardiologist : N/A Chest x-ray : N/A EKG : 01/15/19 in epic Echo : N/A Cardiac Cath : N/A Sleep Study/ CPAP :N/A Fasting Blood Sugar :  N/A    / Checks Blood Sugar -- times a day:   Blood Thinner/ Instructions /Last Dose: N/A ASA / Instructions/ Last Dose :  N/A  Patient denies shortness of breath, chest pain, fever, and cough at this phone interview.

## 2019-04-30 ENCOUNTER — Other Ambulatory Visit (HOSPITAL_COMMUNITY)
Admission: RE | Admit: 2019-04-30 | Discharge: 2019-04-30 | Disposition: A | Discharge status: Home / Self Care | Payer: BC Managed Care – PPO | Source: Ambulatory Visit | Attending: Urology | Admitting: Urology

## 2019-04-30 ENCOUNTER — Other Ambulatory Visit: Payer: Self-pay | Admitting: Urology

## 2019-04-30 DIAGNOSIS — Z01812 Encounter for preprocedural laboratory examination: Secondary | ICD-10-CM | POA: Insufficient documentation

## 2019-04-30 DIAGNOSIS — Z20822 Contact with and (suspected) exposure to covid-19: Secondary | ICD-10-CM | POA: Diagnosis not present

## 2019-04-30 LAB — SARS CORONAVIRUS 2 (TAT 6-24 HRS): SARS Coronavirus 2: NEGATIVE

## 2019-05-01 MED ORDER — MITOMYCIN CHEMO FOR BLADDER INSTILLATION 40 MG: 10.0000 mg | Freq: Once | INTRAVENOUS | Status: AC

## 2019-05-02 ENCOUNTER — Encounter (HOSPITAL_BASED_OUTPATIENT_CLINIC_OR_DEPARTMENT_OTHER): Payer: Self-pay | Admitting: Urology

## 2019-05-02 ENCOUNTER — Ambulatory Visit (HOSPITAL_BASED_OUTPATIENT_CLINIC_OR_DEPARTMENT_OTHER): Payer: BC Managed Care – PPO | Admitting: Anesthesiology

## 2019-05-02 ENCOUNTER — Ambulatory Visit (HOSPITAL_BASED_OUTPATIENT_CLINIC_OR_DEPARTMENT_OTHER)
Admission: RE | Admit: 2019-05-02 | Discharge: 2019-05-02 | Disposition: A | Discharge status: Home / Self Care | Payer: BC Managed Care – PPO | Attending: Urology | Admitting: Urology

## 2019-05-02 ENCOUNTER — Encounter (HOSPITAL_BASED_OUTPATIENT_CLINIC_OR_DEPARTMENT_OTHER)
Admission: RE | Disposition: A | Discharge status: Home / Self Care | Payer: Self-pay | Source: Home / Self Care | Attending: Urology

## 2019-05-02 DIAGNOSIS — E782 Mixed hyperlipidemia: Secondary | ICD-10-CM | POA: Insufficient documentation

## 2019-05-02 DIAGNOSIS — Z7982 Long term (current) use of aspirin: Secondary | ICD-10-CM | POA: Diagnosis not present

## 2019-05-02 DIAGNOSIS — Z79899 Other long term (current) drug therapy: Secondary | ICD-10-CM | POA: Diagnosis not present

## 2019-05-02 DIAGNOSIS — I10 Essential (primary) hypertension: Secondary | ICD-10-CM | POA: Diagnosis not present

## 2019-05-02 DIAGNOSIS — Z87442 Personal history of urinary calculi: Secondary | ICD-10-CM | POA: Diagnosis not present

## 2019-05-02 DIAGNOSIS — N35919 Unspecified urethral stricture, male, unspecified site: Secondary | ICD-10-CM | POA: Insufficient documentation

## 2019-05-02 HISTORY — DX: Retention of urine, unspecified: R33.9

## 2019-05-02 HISTORY — PX: CYSTOSCOPY WITH URETHRAL DILATATION: SHX5125

## 2019-05-02 LAB — POCT I-STAT, CHEM 8
BUN: 16 mg/dL (ref 8–23)
Calcium, Ion: 1.3 mmol/L (ref 1.15–1.40)
Chloride: 103 mmol/L (ref 98–111)
Creatinine, Ser: 0.9 mg/dL (ref 0.61–1.24)
Glucose, Bld: 104 mg/dL — ABNORMAL HIGH (ref 70–99)
HCT: 47 % (ref 39.0–52.0)
Hemoglobin: 16 g/dL (ref 13.0–17.0)
Potassium: 3.5 mmol/L (ref 3.5–5.1)
Sodium: 141 mmol/L (ref 135–145)
TCO2: 27 mmol/L (ref 22–32)

## 2019-05-02 SURGERY — CYSTOSCOPY, WITH URETHRAL DILATION
Anesthesia: General | Site: Urethra

## 2019-05-02 MED ORDER — MIDAZOLAM HCL 2 MG/2ML IJ SOLN
INTRAMUSCULAR | Status: DC | PRN
  Administered 2019-05-02: 1 mg via INTRAVENOUS

## 2019-05-02 MED ORDER — GEMCITABINE CHEMO FOR BLADDER INSTILLATION 2000 MG
2000.0000 mg | Freq: Once | INTRAVENOUS | Status: DC
  Filled 2019-05-02: qty 52.6

## 2019-05-02 MED ORDER — SULFAMETHOXAZOLE-TRIMETHOPRIM 800-160 MG PO TABS: 1.0000 | ORAL_TABLET | Freq: Two times a day (BID) | ORAL | 0 refills | Status: DC

## 2019-05-02 MED ORDER — ONDANSETRON HCL 4 MG/2ML IJ SOLN
4.0000 mg | Freq: Once | INTRAMUSCULAR | Status: DC | PRN
  Filled 2019-05-02: qty 2

## 2019-05-02 MED ORDER — KETOROLAC TROMETHAMINE 30 MG/ML IJ SOLN
INTRAMUSCULAR | Status: AC
  Filled 2019-05-02: qty 1

## 2019-05-02 MED ORDER — GENTAMICIN SULFATE 40 MG/ML IJ SOLN
5.0000 mg/kg | INTRAVENOUS | Status: DC
  Administered 2019-05-02: 16:00:00 435.5 mg via INTRAVENOUS
  Filled 2019-05-02: qty 11

## 2019-05-02 MED ORDER — DEXAMETHASONE SODIUM PHOSPHATE 10 MG/ML IJ SOLN
INTRAMUSCULAR | Status: DC | PRN
  Administered 2019-05-02: 5 mg via INTRAVENOUS

## 2019-05-02 MED ORDER — PROPOFOL 10 MG/ML IV BOLUS
INTRAVENOUS | Status: DC | PRN
  Administered 2019-05-02: 150 mg via INTRAVENOUS

## 2019-05-02 MED ORDER — FENTANYL CITRATE (PF) 100 MCG/2ML IJ SOLN
INTRAMUSCULAR | Status: DC | PRN
  Administered 2019-05-02: 50 ug via INTRAVENOUS
  Administered 2019-05-02: 25 ug via INTRAVENOUS

## 2019-05-02 MED ORDER — FENTANYL CITRATE (PF) 100 MCG/2ML IJ SOLN
INTRAMUSCULAR | Status: AC
  Filled 2019-05-02: qty 2

## 2019-05-02 MED ORDER — LIDOCAINE 2% (20 MG/ML) 5 ML SYRINGE
INTRAMUSCULAR | Status: DC | PRN
  Administered 2019-05-02: 100 mg via INTRAVENOUS

## 2019-05-02 MED ORDER — LIDOCAINE 2% (20 MG/ML) 5 ML SYRINGE
INTRAMUSCULAR | Status: AC
  Filled 2019-05-02: qty 5

## 2019-05-02 MED ORDER — PROPOFOL 10 MG/ML IV BOLUS
INTRAVENOUS | Status: AC
  Filled 2019-05-02: qty 20

## 2019-05-02 MED ORDER — SENNOSIDES-DOCUSATE SODIUM 8.6-50 MG PO TABS: 1.0000 | ORAL_TABLET | Freq: Two times a day (BID) | ORAL | 0 refills | Status: DC

## 2019-05-02 MED ORDER — LACTATED RINGERS IV SOLN: INTRAVENOUS | Status: DC | PRN

## 2019-05-02 MED ORDER — MIDAZOLAM HCL 2 MG/2ML IJ SOLN
INTRAMUSCULAR | Status: AC
  Filled 2019-05-02: qty 2

## 2019-05-02 MED ORDER — OXYCODONE-ACETAMINOPHEN 5-325 MG PO TABS: 1.0000 | ORAL_TABLET | ORAL | 0 refills | Status: DC | PRN

## 2019-05-02 MED ORDER — HYDROMORPHONE HCL 1 MG/ML IJ SOLN
0.2500 mg | INTRAMUSCULAR | Status: DC | PRN
  Filled 2019-05-02: qty 0.5

## 2019-05-02 MED ORDER — ONDANSETRON HCL 4 MG/2ML IJ SOLN
INTRAMUSCULAR | Status: AC
  Filled 2019-05-02: qty 2

## 2019-05-02 MED ORDER — DEXAMETHASONE SODIUM PHOSPHATE 10 MG/ML IJ SOLN
INTRAMUSCULAR | Status: AC
  Filled 2019-05-02: qty 1

## 2019-05-02 MED ORDER — GENTAMICIN SULFATE 40 MG/ML IJ SOLN
5.0000 mg/kg | INTRAVENOUS | Status: DC
  Filled 2019-05-02: qty 10.5

## 2019-05-02 MED ORDER — MITOMYCIN CHEMO FOR BLADDER INSTILLATION 40 MG: 10.0000 mg | Freq: Once | INTRAVENOUS | Status: DC

## 2019-05-02 MED ORDER — MEPERIDINE HCL 25 MG/ML IJ SOLN
6.2500 mg | INTRAMUSCULAR | Status: DC | PRN
  Filled 2019-05-02: qty 1

## 2019-05-02 MED ORDER — IOHEXOL 300 MG/ML  SOLN
INTRAMUSCULAR | Status: DC | PRN
  Administered 2019-05-02: 16:00:00 10 mL

## 2019-05-02 MED ORDER — ONDANSETRON HCL 4 MG/2ML IJ SOLN
INTRAMUSCULAR | Status: DC | PRN
  Administered 2019-05-02: 4 mg via INTRAVENOUS

## 2019-05-02 SURGICAL SUPPLY — 34 items
BAG DRAIN URO-CYSTO SKYTR STRL (DRAIN) ×4 IMPLANT
BAG DRN RND TRDRP ANRFLXCHMBR (UROLOGICAL SUPPLIES) ×2
BAG DRN UROCATH (DRAIN) ×2
BAG URINE DRAIN 2000ML AR STRL (UROLOGICAL SUPPLIES) ×3 IMPLANT
BAG URINE LEG 500ML (DRAIN) IMPLANT
BALLN NEPHROSTOMY (BALLOONS) ×4
BALLOON NEPHROSTOMY (BALLOONS) ×1 IMPLANT
CATH FOLEY 2W COUNCIL 20FR 5CC (CATHETERS) ×3 IMPLANT
CATH FOLEY 2W COUNCIL 5CC 16FR (CATHETERS) IMPLANT
CATH FOLEY 2W COUNCIL 5CC 18FR (CATHETERS) IMPLANT
CATH FOLEY 2WAY  3CC 10FR (CATHETERS)
CATH FOLEY 2WAY 3CC 10FR (CATHETERS) IMPLANT
CATH ROBINSON RED A/P 14FR (CATHETERS) IMPLANT
CLOTH BEACON ORANGE TIMEOUT ST (SAFETY) ×5 IMPLANT
ELECT REM PT RETURN 9FT ADLT (ELECTROSURGICAL) ×4
ELECTRODE REM PT RTRN 9FT ADLT (ELECTROSURGICAL) ×2 IMPLANT
GLOVE BIO SURGEON STRL SZ7.5 (GLOVE) ×4 IMPLANT
GOWN STRL REUS W/ TWL LRG LVL3 (GOWN DISPOSABLE) ×3 IMPLANT
GOWN STRL REUS W/TWL LRG LVL3 (GOWN DISPOSABLE)
GOWN STRL REUS W/TWL XL LVL3 (GOWN DISPOSABLE) ×9 IMPLANT
GUIDEWIRE ANG ZIPWIRE 038X150 (WIRE) ×4 IMPLANT
GUIDEWIRE STR DUAL SENSOR (WIRE) ×3 IMPLANT
HOLDER FOLEY CATH W/STRAP (MISCELLANEOUS) ×3 IMPLANT
KIT TURNOVER CYSTO (KITS) ×4 IMPLANT
MANIFOLD NEPTUNE II (INSTRUMENTS) ×3 IMPLANT
NDL ASPIRATION 22 (NEEDLE) IMPLANT
NDL SAFETY ECLIPSE 18X1.5 (NEEDLE) IMPLANT
NEEDLE ASPIRATION 22 (NEEDLE) IMPLANT
NEEDLE HYPO 18GX1.5 SHARP (NEEDLE)
PACK CYSTO (CUSTOM PROCEDURE TRAY) ×4 IMPLANT
SYR 20ML LL LF (SYRINGE) IMPLANT
TUBE CONNECTING 12'X1/4 (SUCTIONS)
TUBE CONNECTING 12X1/4 (SUCTIONS) IMPLANT
WATER STERILE IRR 3000ML UROMA (IV SOLUTION) ×4 IMPLANT

## 2019-05-02 NOTE — Anesthesia Preprocedure Evaluation (Addendum)
Anesthesia Evaluation  Patient identified by MRN, date of birth, ID band Patient awake    Reviewed: Allergy & Precautions, NPO status , Patient's Chart, lab work & pertinent test results  Airway Mallampati: I  TM Distance: >3 FB Neck ROM: Full    Dental  (+) Teeth Intact, Dental Advisory Given   Pulmonary    Pulmonary exam normal        Cardiovascular hypertension, Pt. on medications and Pt. on home beta blockers Normal cardiovascular exam+ dysrhythmias Atrial Fibrillation      Neuro/Psych    GI/Hepatic   Endo/Other    Renal/GU      Musculoskeletal  (+) Arthritis ,   Abdominal   Peds  Hematology   Anesthesia Other Findings   Reproductive/Obstetrics                            Anesthesia Physical Anesthesia Plan  ASA: II  Anesthesia Plan: General   Post-op Pain Management:    Induction: Intravenous  PONV Risk Score and Plan: 2 and Midazolam and Ondansetron  Airway Management Planned: LMA  Additional Equipment:   Intra-op Plan:   Post-operative Plan:   Informed Consent: I have reviewed the patients History and Physical, chart, labs and discussed the procedure including the risks, benefits and alternatives for the proposed anesthesia with the patient or authorized representative who has indicated his/her understanding and acceptance.       Plan Discussed with: CRNA and Surgeon  Anesthesia Plan Comments:         Anesthesia Quick Evaluation

## 2019-05-02 NOTE — H&P (Signed)
[name] is an 64 y.o. male.    Chief Complaint: Pre-Op Cysto / Urethral Dilation / Mito-C Injection  HPI:   1 - Enlarged Prostate with Bladder Stones - s/p TURP / cystolithalopexy for 49mL prostate with some median lobe and about 3cm volume bladder stones. Path benign. Post-op PVR's <314mL.   2 - Urethral Strictre - new obstructive symptoms beginning about 3 weeks after TURP. Cysto 04/2019 with dense distal striture.   PMH sig for pre-DM (A1c 6), AFib (transies, no blood thinners), Rt open inguinal hernia repair. 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 His PCP is Teressa Lower MD with [facility] in [place].   Today " [name] " is seen to proceed with cysto / urethral dilation / mito-C injreciton for pendulous stricture. No inteval fevers. C19 screen negative. Most recent UA without infectious parameters.     Past Medical History:  Diagnosis Date  . Arthritis    neck , ankles  . Bladder stone   . BPH (benign prostatic hyperplasia)   . Hematuria   . History of kidney stones   . Hypertension   . Mixed hyperlipidemia   . Nocturia   . PAF (paroxysmal atrial fibrillation) ([place]) followed by pcp-- per pt never followed by cardiologist after 2017   per pt had AFib 2017,  had work-up stress test and echo done at [facility] at that time, no other work-up done since,   . Urine retention   . Wears glasses     Past Surgical History:  Procedure Laterality Date  . bladder cauterization  01/16/2019  . COLONOSCOPY     x2  . CYSTOSCOPY WITH LITHOLAPAXY N/A 01/15/2019   Procedure: CYSTOSCOPY WITH LITHOLAPAXY;  Surgeon: Alexis Frock, MD;  Location: [facility];  Service: Urology;  Laterality: N/A;  . CYSTOSTOMY W/ RETROGRADE URETEROSCOPY  1994  . INGUINAL HERNIA REPAIR Right 2006  approx.  . TRANSURETHRAL RESECTION OF PROSTATE N/A 01/15/2019   Procedure: TRANSURETHRAL RESECTION OF THE PROSTATE (TURP);  Surgeon: Alexis Frock, MD;  Location: [facility];  Service: Urology;  Laterality: N/A;    History reviewed. No pertinent family history. Social History:  reports that he has never smoked. He quit smokeless tobacco use about 45 years ago.  His smokeless tobacco use included snuff and chew. He reports previous alcohol use. He reports that he does not use drugs.  Allergies:  Allergies  Allergen Reactions  . Ibuprofen Other (See Comments)    Mouth sores    No medications prior to admission.    Results for orders placed or performed during the hospital encounter of 04/30/19 (from the past 48 hour(s))  SARS CORONAVIRUS 2 (TAT 6-24 HRS) Nasopharyngeal Nasopharyngeal Swab     Status: None   Collection Time: 04/30/19 10:26 AM   Specimen: Nasopharyngeal Swab  Result Value Ref Range   SARS Coronavirus 2 NEGATIVE NEGATIVE    Comment: (NOTE) SARS-CoV-2 target nucleic acids are NOT DETECTED. The SARS-CoV-2 RNA is generally detectable in upper and lower respiratory specimens during the acute phase of infection. Negative results do not preclude SARS-CoV-2 infection, do not rule out co-infections with other pathogens, and should not be used as the sole basis for treatment or other patient management decisions. Negative results must be combined with clinical observations, patient history, and epidemiological information. The expected result is Negative. Fact Sheet for Patients: SugarRoll.be Fact Sheet for Healthcare Providers: https://www.woods-mathews.com/ This test is not yet approved or  cleared by the [place] and  has been authorized for detection and/or diagnosis of SARS-CoV-2 by FDA under an Emergency Use Authorization (EUA). This EUA will remain  in effect (meaning this test can be used) for the duration of the COVID-19 declaration under Section 56 4(b)(1) of the Act, 21 U.S.C. section 360bbb-3(b)(1), unless the authorization is  terminated or revoked sooner. Performed at [facility], [address] [place], [place] [address]    No results found.  Review of Systems  Constitutional: Negative for chills and fever.  Genitourinary: Positive for difficulty urinating.  All other systems reviewed and are negative.   Height 6\' 4"  (1.93 m), weight 86.2 kg. Physical Exam  HENT:  Head: Normocephalic.  Eyes: Pupils are equal, round, and reactive to light.  Cardiovascular: Normal rate.  Respiratory: Effort normal.  GI: Soft.  Genitourinary:    Genitourinary Comments: NO CVAT at present.    Musculoskeletal:        General: Normal range of motion.     Cervical back: Normal range of motion.  Neurological: He is alert.  Skin: Skin is warm.     Assessment/Plan  Proceed as planned with cysto, urethral dilation, mito-C injection for pendulous stricture. Risks, benefits, alternatives, expected peri-op course including temporary foley discussed previously and reiterated today.   , MD 05/02/2019, 7:30 AM

## 2019-05-02 NOTE — Discharge Instructions (Signed)
1 - You may have urinary urgency (bladder spasms) and bloody urine on / off for up to 2 weeks. This is normal.  2 - Call MD or go to ER for fever >102, severe pain / nausea / vomiting not relieved by medications, or acute change in medical status      Post Anesthesia Home Care Instructions  Activity: Get plenty of rest for the remainder of the day. A responsible individual must stay with you for 24 hours following the procedure.  For the next 24 hours, DO NOT: -Drive a car -Operate machinery -Drink alcoholic beverages -Take any medication unless instructed by your physician -Make any legal decisions or sign important papers.  Meals: Start with liquid foods such as gelatin or soup. Progress to regular foods as tolerated. Avoid greasy, spicy, heavy foods. If nausea and/or vomiting occur, drink only clear liquids until the nausea and/or vomiting subsides. Call your physician if vomiting continues.  Special Instructions/Symptoms: Your throat may feel dry or sore from the anesthesia or the breathing tube placed in your throat during surgery. If this causes discomfort, gargle with warm salt water. The discomfort should disappear within 24 hours.       

## 2019-05-02 NOTE — Transfer of Care (Signed)
Immediate Anesthesia Transfer of Care Note  Patient: [name]  Procedure(s) Performed: CYSTOSCOPY WITH URETHRAL DILATATION (N/A Urethra)  Patient Location: PACU  Anesthesia Type:General  Level of Consciousness: drowsy and patient cooperative  Airway & Oxygen Therapy: Patient Spontanous Breathing and Patient connected to nasal cannula oxygen  Post-op Assessment: Report given to RN and Post -op Vital signs reviewed and stable  Post vital signs: Reviewed and stable  Last Vitals:  Vitals Value Taken Time  BP    Temp    Pulse    Resp    SpO2      Last Pain:  Vitals:   05/02/19 1343  TempSrc: Oral  PainSc: 0-No pain      Patients Stated Pain Goal: 4 (05/02/19 1343)  Complications: No apparent anesthesia complications

## 2019-05-02 NOTE — Brief Op Note (Signed)
05/02/2019  4:20 PM  PATIENT:  [name]  64 y.o. male  PRE-OPERATIVE DIAGNOSIS:  URETHRAL STRICTURE  POST-OPERATIVE DIAGNOSIS:  URETHRAL STRICTURE  PROCEDURE:  Procedure(s) with comments: CYSTOSCOPY WITH URETHRAL DILATATION (N/A) - 45 MINS  SURGEON:  Surgeon(s) and Role:    * Sebastian Ache, MD - Primary  PHYSICIAN ASSISTANT:   ASSISTANTS: none   ANESTHESIA:   general  EBL:  minimal   BLOOD ADMINISTERED:none  DRAINS: 27F foley to gravity   LOCAL MEDICATIONS USED:  NONE  SPECIMEN:  No Specimen  DISPOSITION OF SPECIMEN:  N/A  COUNTS:  YES  TOURNIQUET:  * No tourniquets in log *  DICTATION: .Other Dictation: Dictation Number [id number]  PLAN OF CARE: Discharge to home after PACU  PATIENT DISPOSITION:  PACU - hemodynamically stable.   Delay start of Pharmacological VTE agent (>24hrs) due to surgical blood loss or risk of bleeding: yes

## 2019-05-02 NOTE — Anesthesia Procedure Notes (Signed)
Procedure Name: LMA Insertion Date/Time: 05/02/2019 3:57 PM Performed by: Tyrone Nine, CRNA Pre-anesthesia Checklist: Patient identified, Emergency Drugs available, Suction available and Patient being monitored Patient Re-evaluated:Patient Re-evaluated prior to induction Oxygen Delivery Method: Circle system utilized Preoxygenation: Pre-oxygenation with 100% oxygen Induction Type: IV induction Ventilation: Mask ventilation without difficulty LMA: LMA inserted LMA Size: 4.0 Number of attempts: 1 Placement Confirmation: breath sounds checked- equal and bilateral,  CO2 detector and positive ETCO2 Tube secured with: Tape Dental Injury: Teeth and Oropharynx as per pre-operative assessment

## 2019-05-02 NOTE — Anesthesia Postprocedure Evaluation (Signed)
Anesthesia Post Note  Patient: [name]  Procedure(s) Performed: CYSTOSCOPY WITH URETHRAL DILATATION (N/A Urethra)     Patient location during evaluation: PACU Anesthesia Type: General Level of consciousness: awake and alert Pain management: pain level controlled Vital Signs Assessment: post-procedure vital signs reviewed and stable Respiratory status: spontaneous breathing, nonlabored ventilation, respiratory function stable and patient connected to nasal cannula oxygen Cardiovascular status: blood pressure returned to baseline and stable Postop Assessment: no apparent nausea or vomiting Anesthetic complications: no    Last Vitals:  Vitals:   05/02/19 1343 05/02/19 1630  BP: (!) 127/94 (P) 98/75  Pulse: 68 (P) 70  Resp: 18 (P) 16  Temp: (!) 36.4 C   SpO2: 100% (P) 100%    Last Pain:  Vitals:   05/02/19 1343  TempSrc: Oral  PainSc: 0-No pain                 Marieann Zipp DAVID

## 2019-05-03 NOTE — Op Note (Signed)
NAMEMAXIE, SLOVACEK MEDICAL RECORD IR:[id number] ACCOUNT [id number] DATE OF BIRTH:25-May-1955 FACILITY: [facility] LOCATION: [facility] PHYSICIAN:Elba Dendinger, MD  OPERATIVE REPORT  DATE OF PROCEDURE:  05/02/2019  PREOPERATIVE DIAGNOSIS:  Urethral stricture.  PROCEDURE:  Cystoscopy with urethral dilation.  ESTIMATED BLOOD LOSS:  Nil.  MEDICATIONS:  None.  SPECIMENS:  None.  COMPLICATIONS:  [place] Foley catheter to straight drain 10 mL around the balloon.  FINDINGS: 1.  Approximately 3 cm in length distal pendulous urethral stricture.  Estimate 8-French predilation, 20 French post-dilation. 2.  Wide open prostatic fossa. 3.  Unremarkable urinary bladder.  INDICATIONS:  The patient is a very pleasant 64 year old man with long history of obstructive voiding symptoms at became medical refractory due to prostatic hypertrophy.  He then underwent a transurethral resection of the prostate under uncomplicated  fashion several months ago and initially did quite well, voiding afterwards.  He unfortunately developed a recurrence of obstructive voiding symptoms and irritative voiding symptoms now at least as severe as preop.  This constellation of symptoms was  worrisome for possible repeat obstruction and office cystoscopy corroborated a fairly high-grade distal pendulous urethral stricture.  Options were discussed for management including recommended path of operative dilation for diagnostic and therapeutic  intent and he wished to proceed.  Informed consent was obtained and placed in medical record.  DESCRIPTION OF PROCEDURE:  The patient was identified, procedure being cystoscopy with urethral dilation was confirmed.  Procedure timeout was performed, intravenous antibiotics were administered.  General anesthesia induced.  The patient was placed into  a low lithotomy position, sterile field was created prepped and draped base of the penis, perineum and proximal thighs using iodine.  The  stricture is noted to be quite distal, precluding really any rigid cystoscopy.  Therefore, a 0.038 sensor wire was  advanced under fluoroscopic vision to the level of the urinary bladder over which a 20-French urethral dilation balloon was carefully advanced throughout the length of the pendulous urethra and inflated to a pressure of 20 atmospheres, held for 90  seconds.  It was then advanced proximally in overlapping fashion such that the proximal end lie in the presumed area of bladder.  I again inflated to a pressure of 20 atmospheres, held for 90 seconds and then released.  Next, cystourethroscopy was  performed using 21-French rigid cystoscope.  This revealed excellent wide open pendulous urethral channel, but some likely stricture changes approximately 2-3 cm length of the distal urethra.  The remainder of the penis, urethral membrane and urethra was  unremarkable.  The prostatic urethra was wide open with excellent prior TURP defect.  Inspection in the urinary bladder revealed mild trabeculation.  Next, a 20-French [place] catheter was placed over the sensor working wire.  I filled the urinary  bladder and 10 mL around the balloon.  This was connected to straight drain.  Procedure terminated.  The patient tolerated the procedure well.  No immediate complications.  The patient was taken to postanesthesia care in stable condition.  Plan for discharge home.  PN/NUANCE  D:05/02/2019 T:05/03/2019 JOB:[id number]

## 2019-05-29 ENCOUNTER — Ambulatory Visit: Payer: BC Managed Care – PPO | Attending: Internal Medicine

## 2019-05-29 DIAGNOSIS — Z23 Encounter for immunization: Secondary | ICD-10-CM

## 2019-05-29 NOTE — Progress Notes (Signed)
   Covid-19 Vaccination Clinic  Name:  [name]    MRN: [id number] DOB: 04-06-1955  05/29/2019  [name] was observed post Covid-19 immunization for 15 minutes without incident. He was provided with Vaccine Information Sheet and instruction to access the V-Safe system.   [name] was instructed to call 911 with any severe reactions post vaccine: Marland Kitchen Difficulty breathing  . Swelling of face and throat  . A fast heartbeat  . A bad rash all over body  . Dizziness and weakness   Immunizations Administered    Name Date Dose VIS Date Route   Pfizer COVID-19 Vaccine 05/29/2019  2:34 PM 0.3 mL 02/28/2019 Intramuscular   Manufacturer: [employer], [employer]   Lot: [id number]   NDC: [address]-3

## 2019-06-23 ENCOUNTER — Ambulatory Visit: Payer: BC Managed Care – PPO | Attending: Internal Medicine

## 2019-06-23 DIAGNOSIS — Z23 Encounter for immunization: Secondary | ICD-10-CM

## 2019-06-23 NOTE — Progress Notes (Signed)
   Covid-19 Vaccination Clinic  Name:  [name]    MRN: [id number] DOB: [birth year]  06/23/2019  [name] was observed post Covid-19 immunization for 15 minutes without incident. He was provided with Vaccine Information Sheet and instruction to access the V-Safe system.   [name] was instructed to call 911 with any severe reactions post vaccine: Marland Kitchen Difficulty breathing  . Swelling of face and throat  . A fast heartbeat  . A bad rash all over body  . Dizziness and weakness   Immunizations Administered    Name Date Dose VIS Date Route   Pfizer COVID-19 Vaccine 06/23/2019 10:54 AM 0.3 mL 02/28/2019 Intramuscular   Manufacturer: [employer], [employer]   Lot: [id number]   NDC: [address]-7

## 2019-10-03 ENCOUNTER — Encounter: Payer: Self-pay | Admitting: Cardiology

## 2019-10-03 DIAGNOSIS — Z973 Presence of spectacles and contact lenses: Secondary | ICD-10-CM | POA: Insufficient documentation

## 2019-10-03 DIAGNOSIS — N210 Calculus in bladder: Secondary | ICD-10-CM

## 2019-10-03 DIAGNOSIS — I10 Essential (primary) hypertension: Secondary | ICD-10-CM | POA: Insufficient documentation

## 2019-10-03 DIAGNOSIS — Z87442 Personal history of urinary calculi: Secondary | ICD-10-CM | POA: Insufficient documentation

## 2019-10-03 DIAGNOSIS — E782 Mixed hyperlipidemia: Secondary | ICD-10-CM

## 2019-10-03 DIAGNOSIS — N400 Enlarged prostate without lower urinary tract symptoms: Secondary | ICD-10-CM | POA: Insufficient documentation

## 2019-10-03 DIAGNOSIS — Q231 Congenital insufficiency of aortic valve: Secondary | ICD-10-CM | POA: Insufficient documentation

## 2019-10-03 DIAGNOSIS — R351 Nocturia: Secondary | ICD-10-CM | POA: Insufficient documentation

## 2019-10-03 DIAGNOSIS — I480 Paroxysmal atrial fibrillation: Secondary | ICD-10-CM

## 2019-10-03 DIAGNOSIS — R319 Hematuria, unspecified: Secondary | ICD-10-CM | POA: Insufficient documentation

## 2019-10-03 DIAGNOSIS — M5090 Cervical disc disorder, unspecified, unspecified cervical region: Secondary | ICD-10-CM | POA: Insufficient documentation

## 2019-10-03 DIAGNOSIS — M1990 Unspecified osteoarthritis, unspecified site: Secondary | ICD-10-CM

## 2019-10-03 DIAGNOSIS — R339 Retention of urine, unspecified: Secondary | ICD-10-CM

## 2019-10-10 ENCOUNTER — Ambulatory Visit: Payer: BC Managed Care – PPO | Admitting: Cardiology

## 2019-10-10 ENCOUNTER — Other Ambulatory Visit: Payer: Self-pay

## 2019-10-10 ENCOUNTER — Encounter: Payer: Self-pay | Admitting: Cardiology

## 2019-10-10 VITALS — BP 118/82 | HR 63 | Ht 76.0 in | Wt 190.2 lb

## 2019-10-10 DIAGNOSIS — R0600 Dyspnea, unspecified: Secondary | ICD-10-CM

## 2019-10-10 DIAGNOSIS — Q231 Congenital insufficiency of aortic valve: Secondary | ICD-10-CM

## 2019-10-10 DIAGNOSIS — R0609 Other forms of dyspnea: Secondary | ICD-10-CM

## 2019-10-10 DIAGNOSIS — I10 Essential (primary) hypertension: Secondary | ICD-10-CM

## 2019-10-10 DIAGNOSIS — I480 Paroxysmal atrial fibrillation: Secondary | ICD-10-CM

## 2019-10-10 NOTE — Patient Instructions (Signed)
Medication Instructions:  Your physician recommends that you continue on your current medications as directed. Please refer to the Current Medication list given to you today.  *If you need a refill on your cardiac medications before your next appointment, please call your pharmacy*   Lab Work: None.  If you have labs (blood work) drawn today and your tests are completely normal, you will receive your results only by: Marland Kitchen MyChart Message (if you have MyChart) OR . A paper copy in the mail If you have any lab test that is abnormal or we need to change your treatment, we will call you to review the results.   Testing/Procedures: Your physician has requested that you have an echocardiogram. Echocardiography is a painless test that uses sound waves to create images of your heart. It provides your doctor with information about the size and shape of your heart and how well your heart's chambers and valves are working. This procedure takes approximately one hour. There are no restrictions for this procedure.  A zio monitor was ordered today. It will remain on for 14 days. You will then return monitor and event diary in provided box. It takes 1-2 weeks for report to be downloaded and returned to [place]. We will call you with the results. If monitor falls off or has orange flashing light, please call Zio for further instructions.   Your physician has requested that you have an exercise tolerance test. For further information please visit [email or web address] Please also follow instruction sheet, as given.     Follow-Up: At [facility], you and your health needs are our priority.  As part of our continuing mission to provide you with exceptional heart care, we have created designated Provider Care Teams.  These Care Teams include your primary Cardiologist (physician) and Advanced Practice Providers (APPs -  Physician Assistants and Nurse Practitioners) who all work together to provide you with the care  you need, when you need it.  We recommend signing up for the patient portal called "MyChart".  Sign up information is provided on this After Visit Summary.  MyChart is used to connect with patients for Virtual Visits (Telemedicine).  Patients are able to view lab/test results, encounter notes, upcoming appointments, etc.  Non-urgent messages can be sent to your provider as well.   To learn more about what you can do with MyChart, go to [email or web address].    Your next appointment:   6 week(s)  The format for your next appointment:   In Person  Provider:   Gypsy Balsam, MD   Other Instructions   Echocardiogram An echocardiogram is a procedure that uses painless sound waves (ultrasound) to produce an image of the heart. Images from an echocardiogram can provide important information about:  Signs of coronary artery disease (CAD).  Aneurysm detection. An aneurysm is a weak or damaged part of an artery wall that bulges out from the normal force of blood pumping through the body.  Heart size and shape. Changes in the size or shape of the heart can be associated with certain conditions, including heart failure, aneurysm, and CAD.  Heart muscle function.  Heart valve function.  Signs of a past heart attack.  Fluid buildup around the heart.  Thickening of the heart muscle.  A tumor or infectious growth around the heart valves. Tell a health care provider about:  Any allergies you have.  All medicines you are taking, including vitamins, herbs, eye drops, creams, and over-the-counter medicines.  Any blood disorders you have.  Any surgeries you have had.  Any medical conditions you have.  Whether you are pregnant or may be pregnant. What are the risks? Generally, this is a safe procedure. However, problems may occur, including:  Allergic reaction to dye (contrast) that may be used during the procedure. What happens before the procedure? No specific preparation is  needed. You may eat and drink normally. What happens during the procedure?   An IV tube may be inserted into one of your veins.  You may receive contrast through this tube. A contrast is an injection that improves the quality of the pictures from your heart.  A gel will be applied to your chest.  A wand-like tool (transducer) will be moved over your chest. The gel will help to transmit the sound waves from the transducer.  The sound waves will harmlessly bounce off of your heart to allow the heart images to be captured in real-time motion. The images will be recorded on a computer. The procedure may vary among health care providers and hospitals. What happens after the procedure?  You may return to your normal, everyday life, including diet, activities, and medicines, unless your health care provider tells you not to do that. Summary  An echocardiogram is a procedure that uses painless sound waves (ultrasound) to produce an image of the heart.  Images from an echocardiogram can provide important information about the size and shape of your heart, heart muscle function, heart valve function, and fluid buildup around your heart.  You do not need to do anything to prepare before this procedure. You may eat and drink normally.  After the echocardiogram is completed, you may return to your normal, everyday life, unless your health care provider tells you not to do that. This information is not intended to replace advice given to you by your health care provider. Make sure you discuss any questions you have with your health care provider. Document Revised: 06/27/2018 Document Reviewed: 04/08/2016 Elsevier Patient Education  2020 [employer].

## 2019-10-10 NOTE — Progress Notes (Signed)
Cardiology Consultation:    Date:  10/10/2019   ID:  [name], DOB 1955-11-09, MRN [id number]  PCP:  Olive Bass, MD  Cardiologist:  Gypsy Balsam, MD   Referring MD: Olive Bass, MD   No chief complaint on file. I need some testing for insurance company  History of Present Illness:    [name] is a 64 y.o. male who is being seen today for the evaluation of paroxysmal atrial fibrillation at the request of Dough, Doris Cheadle, MD.  I took care of him about 6 years ago.  At the time he got first and the only documented episode of atrial fibrillation.  He was completely asymptomatic, he went to chiropractor, they check his blood pressure find out that he was tachycardic and was diagnosed with atrial fibrillation.  He was sent to hospital and managed appropriately.  Stress test and echocardiogram was done at that time.  He was also told to have bicuspid aortic valve.  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  Does not exercise on the regular basis but his work requires a lot of walking.  Denies have any chest pain tightness squeezing pressure burning chest, no palpitations no dizziness no passing out.  Overall he is doing very well. He does have family history of paroxysmal atrial fibrillation as well as aortic aneurysm. Risk factors for coronary artery disease include hypertension, borderline diabetes. His chads 2 Vascor calculated is 1.  However next year he be 12 which make his chads 2 Vascor 2, on top of that he does have almost borderline diabetes as he said.  Not treated yet.  This is something also we need to watch.  Past Medical History:  Diagnosis Date  . Arthritis    neck , ankles  . Bicuspid aortic valve   . Bladder stone   . BPH (benign prostatic hyperplasia)   . Cervical disc disease   . Hematuria   . History of kidney stones   . Hypertension   .  Mixed hyperlipidemia   . Nephrolithiasis   . Nocturia   . PAF (paroxysmal atrial fibrillation) (HCC) followed by pcp-- per pt never followed by cardiologist after 2017   per pt had AFib 2017,  had work-up stress test and echo done at [facility] at that time, no other work-up done since,   . Urine retention   . Wears glasses     Past Surgical History:  Procedure Laterality Date  . bladder cauterization  01/16/2019  . COLONOSCOPY     x2  . CYSTOSCOPY WITH LITHOLAPAXY N/A 01/15/2019   Procedure: CYSTOSCOPY WITH LITHOLAPAXY;  Surgeon: Sebastian Ache, MD;  Location: [facility];  Service: Urology;  Laterality: N/A;  . CYSTOSCOPY WITH URETHRAL DILATATION N/A 05/02/2019   Procedure: CYSTOSCOPY WITH URETHRAL DILATATION;  Surgeon: Sebastian Ache, MD;  Location: [facility];  Service: Urology;  Laterality: N/A;  45 MINS  . CYSTOSTOMY W/ RETROGRADE URETEROSCOPY  1994  . INGUINAL HERNIA REPAIR Right 2006  approx.  . TRANSURETHRAL RESECTION OF PROSTATE N/A 01/15/2019   Procedure: TRANSURETHRAL RESECTION OF THE PROSTATE (TURP);  Surgeon: Sebastian Ache, MD;  Location: [facility];  Service: Urology;  Laterality: N/A;    Current Medications: Current Meds  Medication Sig  . aspirin EC 81 MG tablet Take 81 mg by mouth daily.  Marland Kitchen  atorvastatin (LIPITOR) 10 MG tablet Take 5 mg by mouth every morning.   . indapamide (LOZOL) 1.25 MG tablet Take 1.25 mg by mouth every morning.   . metoprolol succinate (TOPROL-XL) 50 MG 24 hr tablet Take 50 mg by mouth every morning. Take with or immediately following a meal.      Allergies:   Ibuprofen   Social History   Socioeconomic History  . Marital status: Married    Spouse name: Not on file  . Number of children: Not on file  . Years of education: Not on file  . Highest education level: Not on file  Occupational History  . Not on file  Tobacco Use  . Smoking status: Never Smoker  . Smokeless tobacco:  Former Neurosurgeon    Types: Snuff, Chew  Vaping Use  . Vaping Use: Never used  Substance and Sexual Activity  . Alcohol use: Not Currently  . Drug use: Never  . Sexual activity: Not on file  Other Topics Concern  . Not on file  Social History Narrative  . Not on file   Social Determinants of Health   Financial Resource Strain:   . Difficulty of Paying Living Expenses:   Food Insecurity:   . Worried About Programme researcher, broadcasting/film/video in the Last Year:   . Barista in the Last Year:   Transportation Needs:   . Freight forwarder (Medical):   Marland Kitchen Lack of Transportation (Non-Medical):   Physical Activity:   . Days of Exercise per Week:   . Minutes of Exercise per Session:   Stress:   . Feeling of Stress :   Social Connections:   . Frequency of Communication with Friends and Family:   . Frequency of Social Gatherings with Friends and Family:   . Attends Religious Services:   . Active Member of Clubs or Organizations:   . Attends Banker Meetings:   Marland Kitchen Marital Status:      Family History: The patient's family history includes Aortic aneurysm in his mother; Atrial fibrillation in his sister; Diabetes in his father; Hypertension in his mother; Nephrolithiasis in his father; Pancreatic cancer in his father. ROS:   Please see the history of present illness.    All 14 point review of systems negative except as described per history of present illness.  EKGs/Labs/Other Studies Reviewed:    The following studies were reviewed today:   EKG:  EKG is  ordered today.  The ekg ordered today demonstrates the normal sinus rhythm normal P interval normal QS complex duration morphology no ST segment changes.  Recent Labs: 05/02/2019: BUN 16; Creatinine, Ser 0.90; Hemoglobin 16.0; Potassium 3.5; Sodium 141  Recent Lipid Panel No results found for: CHOL, TRIG, HDL, CHOLHDL, VLDL, LDLCALC, LDLDIRECT  Physical Exam:    VS:  BP 118/82 (BP Location: Left Arm, Patient Position:  Sitting, Cuff Size: Normal)   Pulse 63   Ht 6\' 4"  (1.93 m)   Wt 190 lb 3.2 oz (86.3 kg)   SpO2 96%   BMI 23.15 kg/m     Wt Readings from Last 3 Encounters:  10/10/19 190 lb 3.2 oz (86.3 kg)  05/02/19 186 lb 12.8 oz (84.7 kg)  01/20/19 192 lb (87.1 kg)     GEN:  Well nourished, well developed in no acute distress HEENT: Normal NECK: No JVD; No carotid bruits LYMPHATICS: No lymphadenopathy CARDIAC: RRR, no murmurs, no rubs, no gallops RESPIRATORY:  Clear to auscultation without rales, wheezing or rhonchi  ABDOMEN: Soft, non-tender, non-distended MUSCULOSKELETAL:  No edema; No deformity  SKIN: Warm and dry NEUROLOGIC:  Alert and oriented x 3 PSYCHIATRIC:  Normal affect   ASSESSMENT:    1. PAF (paroxysmal atrial fibrillation) (HCC)   2. Essential hypertension   3. Bicuspid aortic valve    PLAN:    In order of problems listed above:  1. Paroxysmal atrial fibrillation only one documented episode 6 years ago.  He did not have any symptoms while in atrial fibrillation, therefore he we cannot rely only on his history.  The plan will be to do echocardiogram to look at the atrial size.  I will also ask him to wear Zio patch for 2 weeks to see if you have any paroxysms of atrial fibrillation.  His chads 2 Vascor as per today's only 1, no need to anticoagulate yet. 2. Essential hypertension his blood pressures well controlled we will continue present management. 3. Supposedly bicuspid aortic valve, however, if it is truly bicuspid aortic valve he should have significant stenosis but his age which he does not.  I doubt this diagnosis.  We will do echocardiogram to check on that. 4. Dyslipidemia: He is on Lipitor 10 mg which I will continue.  I see his LDL less than 70 last time checked just 2 weeks ago.  Excellent control.   Medication Adjustments/Labs and Tests Ordered: Current medicines are reviewed at length with the patient today.  Concerns regarding medicines are outlined above.    No orders of the defined types were placed in this encounter.  No orders of the defined types were placed in this encounter.   Signed, Georgeanna Lea, MD, [facility]. 10/10/2019 9:11 AM    [facility]

## 2019-10-16 ENCOUNTER — Ambulatory Visit (INDEPENDENT_AMBULATORY_CARE_PROVIDER_SITE_OTHER): Payer: BC Managed Care – PPO

## 2019-10-16 DIAGNOSIS — I480 Paroxysmal atrial fibrillation: Secondary | ICD-10-CM

## 2019-11-03 ENCOUNTER — Ambulatory Visit (INDEPENDENT_AMBULATORY_CARE_PROVIDER_SITE_OTHER): Payer: BC Managed Care – PPO

## 2019-11-03 ENCOUNTER — Other Ambulatory Visit: Payer: Self-pay

## 2019-11-03 DIAGNOSIS — R0600 Dyspnea, unspecified: Secondary | ICD-10-CM

## 2019-11-03 DIAGNOSIS — I480 Paroxysmal atrial fibrillation: Secondary | ICD-10-CM | POA: Diagnosis not present

## 2019-11-03 DIAGNOSIS — R0609 Other forms of dyspnea: Secondary | ICD-10-CM

## 2019-11-03 LAB — ECHOCARDIOGRAM COMPLETE
Area-P 1/2: 4.96 cm2
P 1/2 time: 457 msec
S' Lateral: 2.8 cm

## 2019-11-03 NOTE — Progress Notes (Signed)
Complete echocardiogram performed.  Jimmy Cristiana Yochim RDCS, RVT  

## 2019-11-04 ENCOUNTER — Ambulatory Visit (INDEPENDENT_AMBULATORY_CARE_PROVIDER_SITE_OTHER): Payer: BC Managed Care – PPO

## 2019-11-04 ENCOUNTER — Telehealth: Payer: Self-pay

## 2019-11-04 DIAGNOSIS — I480 Paroxysmal atrial fibrillation: Secondary | ICD-10-CM | POA: Diagnosis not present

## 2019-11-04 DIAGNOSIS — R0600 Dyspnea, unspecified: Secondary | ICD-10-CM | POA: Diagnosis not present

## 2019-11-04 DIAGNOSIS — R0609 Other forms of dyspnea: Secondary | ICD-10-CM

## 2019-11-04 LAB — EXERCISE TOLERANCE TEST
Estimated workload: 13.4 METS
Exercise duration (min): 10 min
Exercise duration (sec): 10 s
MPHR: 156 {beats}/min
Peak HR: 160 {beats}/min
Percent HR: 102 %
RPE: 16
Rest HR: 64 {beats}/min

## 2019-11-04 NOTE — Telephone Encounter (Signed)
-----   Message from Georgeanna Lea, MD sent at 11/03/2019  1:45 PM EDT ----- Echocardiogram showed preserved left ventricle ejection fraction, I do not see bicuspid aortic valve, there is moderate aortic insufficiency, there is enlargement of the ascending aorta measuring 40 mm.  All of this is for medical therapy

## 2019-11-04 NOTE — Telephone Encounter (Signed)
Spoke with patient regarding results and recommendation.  Patient verbalizes understanding and is agreeable to plan of care. Advised patient to call back with any issues or concerns.  

## 2019-11-10 ENCOUNTER — Telehealth: Payer: Self-pay | Admitting: Cardiology

## 2019-11-10 NOTE — Telephone Encounter (Signed)
° ° °  [name] from [employer] calling to give zio patch abnormal report

## 2019-11-10 NOTE — Telephone Encounter (Signed)
Spoke to [place] just now and she let me know that they were wanting to notify of an ventricular asystole pause  due to a second degree AV  Block that lasted for 3.1 seconds.   I will route to DOD to make them aware.

## 2019-11-11 ENCOUNTER — Other Ambulatory Visit: Payer: Self-pay

## 2019-11-11 ENCOUNTER — Encounter: Payer: Self-pay | Admitting: Cardiology

## 2019-11-11 ENCOUNTER — Ambulatory Visit: Payer: BC Managed Care – PPO | Admitting: Cardiology

## 2019-11-11 VITALS — BP 100/80 | HR 62 | Ht 76.0 in | Wt 188.0 lb

## 2019-11-11 DIAGNOSIS — R7303 Prediabetes: Secondary | ICD-10-CM

## 2019-11-11 DIAGNOSIS — I10 Essential (primary) hypertension: Secondary | ICD-10-CM

## 2019-11-11 DIAGNOSIS — I480 Paroxysmal atrial fibrillation: Secondary | ICD-10-CM

## 2019-11-11 DIAGNOSIS — I441 Atrioventricular block, second degree: Secondary | ICD-10-CM

## 2019-11-11 HISTORY — DX: Atrioventricular block, second degree: I44.1

## 2019-11-11 NOTE — Patient Instructions (Signed)
Medication Instructions:  Stop Metoprolol   *If you need a refill on your cardiac medications before your next appointment, please call your pharmacy*   Lab Work: None ordered   If you have labs (blood work) drawn today and your tests are completely normal, you will receive your results only by: Marland Kitchen MyChart Message (if you have MyChart) OR . A paper copy in the mail If you have any lab test that is abnormal or we need to change your treatment, we will call you to review the results.   Testing/Procedures: Your physician has ordered a zio monitor, you will receive it in the mail 3 weeks. It will remain on for 7 days. You will then return monitor and event diary in provided box. It takes 1-2 weeks for report to be downloaded and returned to [place]. We will call you with the results. If monitor falls off or has orange flashing light, please call Zio for further instructions.      Follow-Up: At [facility], you and your health needs are our priority.  As part of our continuing mission to provide you with exceptional heart care, we have created designated Provider Care Teams.  These Care Teams include your primary Cardiologist (physician) and Advanced Practice Providers (APPs -  Physician Assistants and Nurse Practitioners) who all work together to provide you with the care you need, when you need it.  We recommend signing up for the patient portal called "MyChart".  Sign up information is provided on this After Visit Summary.  MyChart is used to connect with patients for Virtual Visits (Telemedicine).  Patients are able to view lab/test results, encounter notes, upcoming appointments, etc.  Non-urgent messages can be sent to your provider as well.   To learn more about what you can do with MyChart, go to [email or web address].    Your next appointment:   3 month(s)  The format for your next appointment:   In Person  Provider:   Gypsy Balsam, MD   Other Instructions None

## 2019-11-11 NOTE — Progress Notes (Signed)
Cardiology Office Note:    Date:  11/11/2019   ID:  [name], DOB Mar 27, 1955, MRN [id number]  PCP:  Scott Bass, MD  Cardiologist:  Scott Balsam, MD    Referring MD: Scott Bass, MD   Chief Complaint  Patient presents with  . Follow-up  M doing well  History of Present Illness:    [name] is a 64 y.o. male with past medical history significant for only 1 documented episode of atrial fibrillation, his chads 2 Vascor is 0, questionable 1 now.  He is not anticoagulated.  Comes today to my office after yesterday we received notification from monitoring company saying that he got second-degree AV block.  Carefully reviewing on his monitor showed second-degree most likely type I AV block which resulted in a pause of 3.1 seconds.  I invited him to the office to talk about this.  He denies having symptoms, there is no chest pain tightness squeezing pressure burning chest there is no dizziness no passing out.  Denies having any palpitations.  Still very active coaching football.  Past Medical History:  Diagnosis Date  . Arthritis    neck , ankles  . Bicuspid aortic valve   . Bladder stone   . BPH (benign prostatic hyperplasia)   . Cervical disc disease   . Hematuria   . History of kidney stones   . Hypertension   . Mixed hyperlipidemia   . Nephrolithiasis   . Nocturia   . PAF (paroxysmal atrial fibrillation) (HCC) followed by pcp-- per pt never followed by cardiologist after 2017   per pt had AFib 2017,  had work-up stress test and echo done at [facility] at that time, no other work-up done since,   . Urine retention   . Wears glasses     Past Surgical History:  Procedure Laterality Date  . bladder cauterization  01/16/2019  . COLONOSCOPY     x2  . CYSTOSCOPY WITH LITHOLAPAXY N/A 01/15/2019   Procedure: CYSTOSCOPY WITH LITHOLAPAXY;  Surgeon: Sebastian Ache, MD;  Location: [facility];  Service: Urology;  Laterality: N/A;  .  CYSTOSCOPY WITH URETHRAL DILATATION N/A 05/02/2019   Procedure: CYSTOSCOPY WITH URETHRAL DILATATION;  Surgeon: Sebastian Ache, MD;  Location: [facility];  Service: Urology;  Laterality: N/A;  45 MINS  . CYSTOSTOMY W/ RETROGRADE URETEROSCOPY  1994  . INGUINAL HERNIA REPAIR Right 2006  approx.  . TRANSURETHRAL RESECTION OF PROSTATE N/A 01/15/2019   Procedure: TRANSURETHRAL RESECTION OF THE PROSTATE (TURP);  Surgeon: Sebastian Ache, MD;  Location[facility];  Service: Urology;  Laterality: N/A;    Current Medications: Current Meds  Medication Sig  . aspirin EC 81 MG tablet Take 81 mg by mouth daily.  Marland Kitchen atorvastatin (LIPITOR) 10 MG tablet Take 5 mg by mouth every morning.   . indapamide (LOZOL) 1.25 MG tablet Take 1.25 mg by mouth every morning.   . metoprolol succinate (TOPROL-XL) 50 MG 24 hr tablet Take 50 mg by mouth every morning. Take with or immediately following a meal.      Allergies:   Ibuprofen   Social History   Socioeconomic History  . Marital status: Married    Spouse name: Not on file  . Number of children: Not on file  . Years of education: Not on file  . Highest education level: Not on file  Occupational History  . Not on file  Tobacco Use  . Smoking status: Never Smoker  .  Smokeless tobacco: Former Neurosurgeon    Types: Snuff, Chew  Vaping Use  . Vaping Use: Never used  Substance and Sexual Activity  . Alcohol use: Not Currently  . Drug use: Never  . Sexual activity: Not on file  Other Topics Concern  . Not on file  Social History Narrative  . Not on file   Social Determinants of Health   Financial Resource Strain:   . Difficulty of Paying Living Expenses: Not on file  Food Insecurity:   . Worried About Programme researcher, broadcasting/film/video in the Last Year: Not on file  . Ran Out of Food in the Last Year: Not on file  Transportation Needs:   . Lack of Transportation (Medical): Not on file  . Lack of Transportation (Non-Medical): Not on file    Physical Activity:   . Days of Exercise per Week: Not on file  . Minutes of Exercise per Session: Not on file  Stress:   . Feeling of Stress : Not on file  Social Connections:   . Frequency of Communication with Friends and Family: Not on file  . Frequency of Social Gatherings with Friends and Family: Not on file  . Attends Religious Services: Not on file  . Active Member of Clubs or Organizations: Not on file  . Attends Banker Meetings: Not on file  . Marital Status: Not on file     Family History: The patient's family history includes Aortic aneurysm in his mother; Atrial fibrillation in his sister; Diabetes in his father; Hypertension in his mother; Nephrolithiasis in his father; Pancreatic cancer in his father. ROS:   Please see the history of present illness.    All 14 point review of systems negative except as described per history of present illness  EKGs/Labs/Other Studies Reviewed:      Recent Labs: 05/02/2019: BUN 16; Creatinine, Ser 0.90; Hemoglobin 16.0; Potassium 3.5; Sodium 141  Recent Lipid Panel No results found for: CHOL, TRIG, HDL, CHOLHDL, VLDL, LDLCALC, LDLDIRECT  Physical Exam:    VS:  BP 100/80 (BP Location: Right Arm, Patient Position: Sitting, Cuff Size: Normal)   Pulse 62   Ht 6\' 4"  (1.93 m)   Wt 188 lb (85.3 kg)   SpO2 98%   BMI 22.88 kg/m     Wt Readings from Last 3 Encounters:  11/11/19 188 lb (85.3 kg)  10/10/19 190 lb 3.2 oz (86.3 kg)  05/02/19 186 lb 12.8 oz (84.7 kg)     GEN:  Well nourished, well developed in no acute distress HEENT: Normal NECK: No JVD; No carotid bruits LYMPHATICS: No lymphadenopathy CARDIAC: RRR, no murmurs, no rubs, no gallops RESPIRATORY:  Clear to auscultation without rales, wheezing or rhonchi  ABDOMEN: Soft, non-tender, non-distended MUSCULOSKELETAL:  No edema; No deformity  SKIN: Warm and dry LOWER EXTREMITIES: no swelling NEUROLOGIC:  Alert and oriented x 3 PSYCHIATRIC:  Normal affect    ASSESSMENT:    1. PAF (paroxysmal atrial fibrillation) (HCC)   2. Second degree Mobitz I AV block   3. Essential hypertension   4. Prediabetes    PLAN:    In order of problems listed above:  1. Paroxysmal atrial fibrillation only one documented episode years ago monitor that you were recently did not show any episodes of atrial fibrillation. 2. Second-degree type I AV block.  I will discontinue his metoprolol.  Will repeat another monitoring within a month or so.  Luckily he is completely asymptomatic.  I anticipate in the future we may  have difficulty managing this scenario if he truly got tachycardia parotid syndrome. 3. Essential hypertension his blood pressure actually is low.  I asked him to keep checking his blood pressure on the regular basis and bring it to [place] within the next 2 weeks or so. 4. Prediabetes.  Doing well from that point review.   Medication Adjustments/Labs and Tests Ordered: Current medicines are reviewed at length with the patient today.  Concerns regarding medicines are outlined above.  No orders of the defined types were placed in this encounter.  Medication changes: No orders of the defined types were placed in this encounter.   Signed, Scott Lea, MD, [facility] 11/11/2019 8:22 AM    [facility]

## 2019-11-27 ENCOUNTER — Ambulatory Visit: Payer: BC Managed Care – PPO | Admitting: Cardiology

## 2019-11-30 ENCOUNTER — Ambulatory Visit (INDEPENDENT_AMBULATORY_CARE_PROVIDER_SITE_OTHER): Payer: BC Managed Care – PPO

## 2019-11-30 DIAGNOSIS — I480 Paroxysmal atrial fibrillation: Secondary | ICD-10-CM

## 2020-02-24 ENCOUNTER — Encounter: Payer: Self-pay | Admitting: Cardiology

## 2020-02-24 ENCOUNTER — Other Ambulatory Visit: Payer: Self-pay

## 2020-02-24 ENCOUNTER — Ambulatory Visit: Payer: BC Managed Care – PPO | Admitting: Cardiology

## 2020-02-24 VITALS — BP 122/82 | HR 77 | Ht 76.0 in | Wt 189.0 lb

## 2020-02-24 DIAGNOSIS — E782 Mixed hyperlipidemia: Secondary | ICD-10-CM | POA: Diagnosis not present

## 2020-02-24 DIAGNOSIS — R0789 Other chest pain: Secondary | ICD-10-CM

## 2020-02-24 DIAGNOSIS — I10 Essential (primary) hypertension: Secondary | ICD-10-CM

## 2020-02-24 DIAGNOSIS — I441 Atrioventricular block, second degree: Secondary | ICD-10-CM

## 2020-02-24 DIAGNOSIS — I480 Paroxysmal atrial fibrillation: Secondary | ICD-10-CM | POA: Diagnosis not present

## 2020-02-24 DIAGNOSIS — R7303 Prediabetes: Secondary | ICD-10-CM

## 2020-02-24 HISTORY — DX: Other chest pain: R07.89

## 2020-02-24 MED ORDER — METOPROLOL TARTRATE 100 MG PO TABS: 100.0000 mg | ORAL_TABLET | Freq: Once | ORAL | 0 refills | Status: DC

## 2020-02-24 NOTE — Patient Instructions (Signed)
Medication Instructions:  Your physician recommends that you continue on your current medications as directed. Please refer to the Current Medication list given to you today.  *If you need a refill on your cardiac medications before your next appointment, please call your pharmacy*   Lab Work: Your physician recommends that you return for lab work in: 3-7 days before CT: BMP  If you have labs (blood work) drawn today and your tests are completely normal, you will receive your results only by: Marland Kitchen MyChart Message (if you have MyChart) OR . A paper copy in the mail If you have any lab test that is abnormal or we need to change your treatment, we will call you to review the results.   Testing/Procedures: Your cardiac CT will be scheduled at one of the below locations:   [facility] [address] [place], [place] [address] [phone]  [place] [address] [place], [place] [address] [phone]  If scheduled at [facility], please arrive at the [facility] main entrance of [facility] 30 minutes prior to test start time. Proceed to the [facility] Radiology Department (first floor) to check-in and test prep.  If scheduled at [facility], please arrive 15 mins early for check-in and test prep.  Please follow these instructions carefully (unless otherwise directed):  Hold all erectile dysfunction medications at least 3 days (72 hrs) prior to test.  On the Night Before the Test: . Be sure to Drink plenty of water. . Do not consume any caffeinated/decaffeinated beverages or chocolate 12 hours prior to your test. . Do not take any antihistamines 12 hours prior to your test.  On the Day of the Test: . Drink plenty of water. Do not drink any water within one hour of the test. . Do not eat any food 4 hours prior to the test. . You may take your regular medications prior to  the test.  . Take metoprolol (Lopressor) two hours prior to test.            After the Test: . Drink plenty of water. . After receiving IV contrast, you may experience a mild flushed feeling. This is normal. . On occasion, you may experience a mild rash up to 24 hours after the test. This is not dangerous. If this occurs, you can take Benadryl 25 mg and increase your fluid intake. . If you experience trouble breathing, this can be serious. If it is severe call 911 IMMEDIATELY. If it is mild, please call our office. . If you take any of these medications: Glipizide/Metformin, Avandament, Glucavance, please do not take 48 hours after completing test unless otherwise instructed.   Once we have confirmed authorization from your insurance company, we will call you to set up a date and time for your test. Based on how quickly your insurance processes prior authorizations requests, please allow up to 4 weeks to be contacted for scheduling your Cardiac CT appointment. Be advised that routine Cardiac CT appointments could be scheduled as many as 8 weeks after your provider has ordered it.  For non-scheduling related questions, please contact the cardiac imaging nurse navigator should you have any questions/concerns: Marchia Bond, Cardiac Imaging Nurse Navigator Burley Saver, Interim Cardiac Imaging Nurse [place] [facility] Direct Office Dial: [phone]   For scheduling needs, including cancellations and rescheduling, please call [place], [phone].     Follow-Up: At [facility],  you and your health needs are our priority.  As part of our continuing mission to provide you with exceptional heart care, we have created designated Provider Care Teams.  These Care Teams include your primary Cardiologist (physician) and Advanced Practice Providers (APPs -  Physician Assistants and Nurse Practitioners) who all work together to provide you with the care you need,  when you need it.  We recommend signing up for the patient portal called "MyChart".  Sign up information is provided on this After Visit Summary.  MyChart is used to connect with patients for Virtual Visits (Telemedicine).  Patients are able to view lab/test results, encounter notes, upcoming appointments, etc.  Non-urgent messages can be sent to your provider as well.   To learn more about what you can do with MyChart, go to [email or web address].    Your next appointment:   4 month(s)  The format for your next appointment:   In Person  Provider:   Jenne Campus, MD :    Other Instructions  Cardiac CT Angiogram A cardiac CT angiogram is a procedure to look at the heart and the area around the heart. It may be done to help find the cause of chest pains or other symptoms of heart disease. During this procedure, a substance called contrast dye is injected into the blood vessels in the area to be checked. A large X-ray machine, called a CT scanner, then takes detailed pictures of the heart and the surrounding area. The procedure is also sometimes called a coronary CT angiogram, coronary artery scanning, or CTA. A cardiac CT angiogram allows the health care provider to see how well blood is flowing to and from the heart. The health care provider will be able to see if there are any problems, such as:  Blockage or narrowing of the coronary arteries in the heart.  Fluid around the heart.  Signs of weakness or disease in the muscles, valves, and tissues of the heart. Tell a health care provider about:  Any allergies you have. This is especially important if you have had a previous allergic reaction to contrast dye.  All medicines you are taking, including vitamins, herbs, eye drops, creams, and over-the-counter medicines.  Any blood disorders you have.  Any surgeries you have had.  Any medical conditions you have.  Whether you are pregnant or may be pregnant.  Any anxiety  disorders, chronic pain, or other conditions you have that may increase your stress or prevent you from lying still. What are the risks? Generally, this is a safe procedure. However, problems may occur, including:  Bleeding.  Infection.  Allergic reactions to medicines or dyes.  Damage to other structures or organs.  Kidney damage from the contrast dye that is used.  Increased risk of cancer from radiation exposure. This risk is low. Talk with your health care provider about: ? The risks and benefits of testing. ? How you can receive the lowest dose of radiation. What happens before the procedure?  Wear comfortable clothing and remove any jewelry, glasses, dentures, and hearing aids.  Follow instructions from your health care provider about eating and drinking. This may include: ? For 12 hours before the procedure -- avoid caffeine. This includes tea, coffee, soda, energy drinks, and diet pills. Drink plenty of water or other fluids that do not have caffeine in them. Being well hydrated can prevent complications. ? For 4-6 hours before the procedure -- stop eating and drinking. The contrast dye can cause nausea, but this is  less likely if your stomach is empty.  Ask your health care provider about changing or stopping your regular medicines. This is especially important if you are taking diabetes medicines, blood thinners, or medicines to treat problems with erections (erectile dysfunction). What happens during the procedure?   Hair on your chest may need to be removed so that small sticky patches called electrodes can be placed on your chest. These will transmit information that helps to monitor your heart during the procedure.  An IV will be inserted into one of your veins.  You might be given a medicine to control your heart rate during the procedure. This will help to ensure that good images are obtained.  You will be asked to lie on an exam table. This table will slide in and  out of the CT machine during the procedure.  Contrast dye will be injected into the IV. You might feel warm, or you may get a metallic taste in your mouth.  You will be given a medicine called nitroglycerin. This will relax or dilate the arteries in your heart.  The table that you are lying on will move into the CT machine tunnel for the scan.  The person running the machine will give you instructions while the scans are being done. You may be asked to: ? Keep your arms above your head. ? Hold your breath. ? Stay very still, even if the table is moving.  When the scanning is complete, you will be moved out of the machine.  The IV will be removed. The procedure may vary among health care providers and hospitals. What can I expect after the procedure? After your procedure, it is common to have:  A metallic taste in your mouth from the contrast dye.  A feeling of warmth.  A headache from the nitroglycerin. Follow these instructions at home:  Take over-the-counter and prescription medicines only as told by your health care provider.  If you are told, drink enough fluid to keep your urine pale yellow. This will help to flush the contrast dye out of your body.  Most people can return to their normal activities right after the procedure. Ask your health care provider what activities are safe for you.  It is up to you to get the results of your procedure. Ask your health care provider, or the department that is doing the procedure, when your results will be ready.  Keep all follow-up visits as told by your health care provider. This is important. Contact a health care provider if:  You have any symptoms of allergy to the contrast dye. These include: ? Shortness of breath. ? Rash or hives. ? A racing heartbeat. Summary  A cardiac CT angiogram is a procedure to look at the heart and the area around the heart. It may be done to help find the cause of chest pains or other symptoms of  heart disease.  During this procedure, a large X-ray machine, called a CT scanner, takes detailed pictures of the heart and the surrounding area after a contrast dye has been injected into blood vessels in the area.  Ask your health care provider about changing or stopping your regular medicines before the procedure. This is especially important if you are taking diabetes medicines, blood thinners, or medicines to treat erectile dysfunction.  If you are told, drink enough fluid to keep your urine pale yellow. This will help to flush the contrast dye out of your body. This information is not intended  to replace advice given to you by your health care provider. Make sure you discuss any questions you have with your health care provider. Document Revised: 10/30/2018 Document Reviewed: 10/30/2018 Elsevier Patient Education  [place].

## 2020-02-24 NOTE — Progress Notes (Signed)
Cardiology Office Note:    Date:  02/24/2020   ID:  [name], DOB 19-Sep-1955, MRN [id number]  PCP:  Olive Bass, MD  Cardiologist:  Gypsy Balsam, MD    Referring MD: Olive Bass, MD   Chief Complaint  Patient presents with  . follow up BP monitoring    History of Present Illness:    [name] is a 64 y.o. male with past medical history significant for 1 documented episode of atrial fibrillation, not anticoagulated since his chads 2 Vascor is questionable 1.  He comes today 2 months of follow-up.  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  Does not exercise on the regular basis in spite of the fact that he does have exercise equipment at home.  Described to have few episode of chest tightness that happen when he either go up he will does do heavy exercise.  Does have very rare.  I did review his record he did have a stress test done in the summer he did very well he walked 9 minutes on the treadmill did not have chest pain but did develop some ST segment changes.  Since he was asymptomatic at the time we did not do anything about this except risk factors modifications but now since he started having symptoms it is concerning.  Denies have any palpitations no dizziness no passing out.  Past Medical History:  Diagnosis Date  . Acute cystitis with hematuria 03/04/2019  . Arthritis    neck , ankles  . Bicuspid aortic valve   . Bladder stone   . BPH (benign prostatic hyperplasia)   . Cervical disc disease   . Gross hematuria 03/25/2018   Formatting of this note might be different from the original. 2019, 2020  . Hematuria   . History of kidney stones   . Hypertension   . Mixed hyperlipidemia   . Nephrolithiasis   . Nocturia   . PAF (paroxysmal atrial fibrillation) (HCC) followed by pcp-- per pt never followed by cardiologist after 2017   per pt had  AFib 2017,  had work-up stress test and echo done at [facility] at that time, no other work-up done since,   . Prediabetes 03/21/2016   Formatting of this note might be different from the original. 2018: 135/6.0  . Prostatic hyperplasia 01/15/2019  . Screening for lipid disorders 03/21/2016  . Second degree Mobitz I AV block 11/11/2019  . Urine retention   . Wears glasses   . Wellness examination 03/21/2016    Past Surgical History:  Procedure Laterality Date  . bladder cauterization  01/16/2019  . COLONOSCOPY     x2  . CYSTOSCOPY WITH LITHOLAPAXY N/A 01/15/2019   Procedure: CYSTOSCOPY WITH LITHOLAPAXY;  Surgeon: Sebastian Ache, MD;  Location: [facility];  Service: Urology;  Laterality: N/A;  . CYSTOSCOPY WITH URETHRAL DILATATION N/A 05/02/2019   Procedure: CYSTOSCOPY WITH URETHRAL DILATATION;  Surgeon: Sebastian Ache, MD;  Location: [facility];  Service: Urology;  Laterality: N/A;  45 MINS  . CYSTOSTOMY W/ RETROGRADE URETEROSCOPY  1994  . INGUINAL HERNIA REPAIR Right 2006  approx.  . TRANSURETHRAL RESECTION OF PROSTATE N/A 01/15/2019   Procedure: TRANSURETHRAL RESECTION OF THE PROSTATE (TURP);  Surgeon: Sebastian Ache, MD;  Location: [facility];  Service: Urology;  Laterality: N/A;    Current Medications: Current Meds  Medication Sig  . aspirin EC 81 MG tablet Take 81 mg by mouth daily.  Marland Kitchen atorvastatin (LIPITOR) 10 MG tablet Take 5 mg by mouth every morning.   . [DISCONTINUED] indapamide (LOZOL) 1.25 MG tablet Take 1.25 mg by mouth every morning.      Allergies:   Ibuprofen   Social History   Socioeconomic History  . Marital status: Married    Spouse name: Not on file  . Number of children: Not on file  . Years of education: Not on file  . Highest education level: Not on file  Occupational History  . Not on file  Tobacco Use  . Smoking status: Never Smoker  . Smokeless tobacco: Former Neurosurgeon    Types: Snuff, Chew    Vaping Use  . Vaping Use: Never used  Substance and Sexual Activity  . Alcohol use: Not Currently  . Drug use: Never  . Sexual activity: Not on file  Other Topics Concern  . Not on file  Social History Narrative  . Not on file   Social Determinants of Health   Financial Resource Strain:   . Difficulty of Paying Living Expenses: Not on file  Food Insecurity:   . Worried About Programme researcher, broadcasting/film/video in the Last Year: Not on file  . Ran Out of Food in the Last Year: Not on file  Transportation Needs:   . Lack of Transportation (Medical): Not on file  . Lack of Transportation (Non-Medical): Not on file  Physical Activity:   . Days of Exercise per Week: Not on file  . Minutes of Exercise per Session: Not on file  Stress:   . Feeling of Stress : Not on file  Social Connections:   . Frequency of Communication with Friends and Family: Not on file  . Frequency of Social Gatherings with Friends and Family: Not on file  . Attends Religious Services: Not on file  . Active Member of Clubs or Organizations: Not on file  . Attends Banker Meetings: Not on file  . Marital Status: Not on file     Family History: The patient's family history includes Aortic aneurysm in his mother; Atrial fibrillation in his sister; Diabetes in his father; Hypertension in his mother; Nephrolithiasis in his father; Pancreatic cancer in his father. ROS:   Please see the history of present illness.    All 14 point review of systems negative except as described per history of present illness  EKGs/Labs/Other Studies Reviewed:      Recent Labs: 05/02/2019: BUN 16; Creatinine, Ser 0.90; Hemoglobin 16.0; Potassium 3.5; Sodium 141  Recent Lipid Panel No results found for: CHOL, TRIG, HDL, CHOLHDL, VLDL, LDLCALC, LDLDIRECT  Physical Exam:    VS:  BP 122/82 (BP Location: Right Arm, Patient Position: Sitting)   Pulse 77   Ht 6\' 4"  (1.93 m)   Wt 189 lb (85.7 kg)   SpO2 98%   BMI 23.01 kg/m      Wt Readings from Last 3 Encounters:  02/24/20 189 lb (85.7 kg)  11/11/19 188 lb (85.3 kg)  10/10/19 190 lb 3.2 oz (86.3 kg)     GEN:  Well nourished, well developed in no acute distress HEENT: Normal NECK: No JVD; No carotid bruits LYMPHATICS: No lymphadenopathy CARDIAC: RRR, no murmurs, no rubs, no gallops RESPIRATORY:  Clear to auscultation without rales, wheezing or rhonchi  ABDOMEN: Soft, non-tender, non-distended MUSCULOSKELETAL:  No edema; No deformity  SKIN: Warm and dry LOWER EXTREMITIES: no swelling NEUROLOGIC:  Alert and oriented x 3 PSYCHIATRIC:  Normal affect   ASSESSMENT:    1. PAF (paroxysmal atrial fibrillation) (HCC)   2. Primary hypertension   3. Second degree Mobitz I AV block   4. Mixed hyperlipidemia   5. Atypical chest pain   6. Prediabetes    PLAN:    In order of problems listed above:  1. Paroxysmal atrial fibrillation denies having any palpitations.  We will continue present management.  Again his chads 2 vascular is questionable 1. 2. Essential hypertension he brought blood pressure measurements to me and majority of time his blood pressure is acceptable few times blood pressure was more than 140/90 but majority of time is normal.  Therefore, I will not alter any of his medications. 3. Dyslipidemia: I did review lab work test done by primary care physician I see cholesterol from 09/23/2019 showing LDL 69 HDL of 54 triglycerides 121.  We will continue present management which involves a moderate intensity statin form of Lipitor 10. 4. Prediabetes: His hemoglobin A1c gap from July of this year being 5.7.  We did talk about need to exercise and good diet. 5. Chest pain with some suspicious characteristics only happening with extreme exertion.  We did talk about options for the situation meaning medical therapy versus cardiac catheterization versus coronary CT angio.  He would like to do coronary CT angio.  We have to be careful with beta blockade since he  does have some history of AV block.   Medication Adjustments/Labs and Tests Ordered: Current medicines are reviewed at length with the patient today.  Concerns regarding medicines are outlined above.  No orders of the defined types were placed in this encounter.  Medication changes: No orders of the defined types were placed in this encounter.   Signed, Georgeanna Lea, MD, [facility] 02/24/2020 8:31 AM    [facility]

## 2020-02-24 NOTE — Addendum Note (Signed)
Addended by: Hazle Quant on: 02/24/2020 08:40 AM   Modules accepted: Orders

## 2020-03-18 ENCOUNTER — Telehealth (HOSPITAL_COMMUNITY): Payer: Self-pay | Admitting: Emergency Medicine

## 2020-03-18 NOTE — Telephone Encounter (Signed)
Pt returning phone call regarding upcoming cardiac imaging study; pt verbalizes understanding of appt date/time, parking situation and where to check in, pre-test NPO status and medications ordered, and verified current allergies; name and call back number provided for further questions should they arise Rockwell Alexandria RN Navigator Cardiac Imaging [facility] [phone] office [phone] cell   Pt getting labs today 12/30; pt instructed to take 100mg  metoprolol 2 hr prior to scan 

## 2020-03-18 NOTE — Telephone Encounter (Signed)
Attempted to call patient regarding upcoming cardiac CT appointment. °Left message on voicemail with name and callback number °Dracen Reigle RN Navigator Cardiac Imaging °[facility] °[phone] Office °[phone] Cell ° °

## 2020-03-19 LAB — BASIC METABOLIC PANEL
BUN/Creatinine Ratio: 23 (ref 10–24)
BUN: 24 mg/dL (ref 8–27)
CO2: 26 mmol/L (ref 20–29)
Calcium: 9.7 mg/dL (ref 8.6–10.2)
Chloride: 101 mmol/L (ref 96–106)
Creatinine, Ser: 1.03 mg/dL (ref 0.76–1.27)
GFR calc Af Amer: 88 mL/min/{1.73_m2} (ref >59)
GFR calc non Af Amer: 76 mL/min/{1.73_m2} (ref >59)
Glucose: 120 mg/dL — ABNORMAL HIGH (ref 65–99)
Potassium: 3.9 mmol/L (ref 3.5–5.2)
Sodium: 140 mmol/L (ref 134–144)

## 2020-03-20 DIAGNOSIS — Q2112 Patent foramen ovale: Secondary | ICD-10-CM

## 2020-03-20 HISTORY — DX: Patent foramen ovale: Q21.12

## 2020-03-21 DIAGNOSIS — N210 Calculus in bladder: Secondary | ICD-10-CM | POA: Insufficient documentation

## 2020-03-21 DIAGNOSIS — R339 Retention of urine, unspecified: Secondary | ICD-10-CM | POA: Insufficient documentation

## 2020-03-23 ENCOUNTER — Ambulatory Visit (HOSPITAL_COMMUNITY)
Admission: RE | Admit: 2020-03-23 | Discharge: 2020-03-23 | Disposition: A | Discharge status: Home / Self Care | Payer: BC Managed Care – PPO | Source: Ambulatory Visit | Attending: Cardiology | Admitting: Cardiology

## 2020-03-23 ENCOUNTER — Other Ambulatory Visit: Payer: Self-pay

## 2020-03-23 DIAGNOSIS — R0789 Other chest pain: Secondary | ICD-10-CM

## 2020-03-23 DIAGNOSIS — I441 Atrioventricular block, second degree: Secondary | ICD-10-CM | POA: Insufficient documentation

## 2020-03-23 DIAGNOSIS — E782 Mixed hyperlipidemia: Secondary | ICD-10-CM | POA: Insufficient documentation

## 2020-03-23 MED ORDER — IOHEXOL 350 MG/ML SOLN
80.0000 mL | Freq: Once | INTRAVENOUS | Status: AC | PRN
  Administered 2020-03-23: 80 mL via INTRAVENOUS

## 2020-03-23 MED ORDER — NITROGLYCERIN 0.4 MG SL SUBL
SUBLINGUAL_TABLET | SUBLINGUAL | Status: AC
  Filled 2020-03-23: qty 2

## 2020-03-23 MED ORDER — NITROGLYCERIN 0.4 MG SL SUBL
0.8000 mg | SUBLINGUAL_TABLET | Freq: Once | SUBLINGUAL | Status: AC
  Administered 2020-03-23: 0.8 mg via SUBLINGUAL

## 2020-06-30 ENCOUNTER — Ambulatory Visit: Payer: BC Managed Care – PPO | Admitting: Cardiology

## 2020-06-30 ENCOUNTER — Encounter: Payer: Self-pay | Admitting: Cardiology

## 2020-06-30 ENCOUNTER — Other Ambulatory Visit: Payer: Self-pay

## 2020-06-30 VITALS — BP 110/72 | HR 74 | Ht 76.0 in | Wt 189.0 lb

## 2020-06-30 DIAGNOSIS — I480 Paroxysmal atrial fibrillation: Secondary | ICD-10-CM | POA: Diagnosis not present

## 2020-06-30 DIAGNOSIS — E782 Mixed hyperlipidemia: Secondary | ICD-10-CM

## 2020-06-30 DIAGNOSIS — I351 Nonrheumatic aortic (valve) insufficiency: Secondary | ICD-10-CM | POA: Insufficient documentation

## 2020-06-30 NOTE — Patient Instructions (Signed)
Medication Instructions:  Your physician recommends that you continue on your current medications as directed. Please refer to the Current Medication list given to you today.  *If you need a refill on your cardiac medications before your next appointment, please call your pharmacy*   Lab Work: None If you have labs (blood work) drawn today and your tests are completely normal, you will receive your results only by: . MyChart Message (if you have MyChart) OR . A paper copy in the mail If you have any lab test that is abnormal or we need to change your treatment, we will call you to review the results.   Testing/Procedures: Your physician has requested that you have an echocardiogram. Echocardiography is a painless test that uses sound waves to create images of your heart. It provides your doctor with information about the size and shape of your heart and how well your heart's chambers and valves are working. This procedure takes approximately one hour. There are no restrictions for this procedure.     Follow-Up: At [facility], you and your health needs are our priority.  As part of our continuing mission to provide you with exceptional heart care, we have created designated Provider Care Teams.  These Care Teams include your primary Cardiologist (physician) and Advanced Practice Providers (APPs -  Physician Assistants and Nurse Practitioners) who all work together to provide you with the care you need, when you need it.  We recommend signing up for the patient portal called "MyChart".  Sign up information is provided on this After Visit Summary.  MyChart is used to connect with patients for Virtual Visits (Telemedicine).  Patients are able to view lab/test results, encounter notes, upcoming appointments, etc.  Non-urgent messages can be sent to your provider as well.   To learn more about what you can do with MyChart, go to [email or web address].    Your next appointment:   1  year(s)  The format for your next appointment:   In Person  Provider:   Robert Krasowski, MD   Other Instructions   Echocardiogram An echocardiogram is a test that uses sound waves (ultrasound) to produce images of the heart. Images from an echocardiogram can provide important information about:  Heart size and shape.  The size and thickness and movement of your heart's walls.  Heart muscle function and strength.  Heart valve function or if you have stenosis. Stenosis is when the heart valves are too narrow.  If blood is flowing backward through the heart valves (regurgitation).  A tumor or infectious growth around the heart valves.  Areas of heart muscle that are not working well because of poor blood flow or injury from a heart attack.  Aneurysm detection. An aneurysm is a weak or damaged part of an artery wall. The wall bulges out from the normal force of blood pumping through the body. Tell a health care provider about:  Any allergies you have.  All medicines you are taking, including vitamins, herbs, eye drops, creams, and over-the-counter medicines.  Any blood disorders you have.  Any surgeries you have had.  Any medical conditions you have.  Whether you are pregnant or may be pregnant. What are the risks? Generally, this is a safe test. However, problems may occur, including an allergic reaction to dye (contrast) that may be used during the test. What happens before the test? No specific preparation is needed. You may eat and drink normally. What happens during the test?  You will take off your   clothes from the waist up and put on a hospital gown.  Electrodes or electrocardiogram (ECG)patches may be placed on your chest. The electrodes or patches are then connected to a device that monitors your heart rate and rhythm.  You will lie down on a table for an ultrasound exam. A gel will be applied to your chest to help sound waves pass through your skin.  A  handheld device, called a transducer, will be pressed against your chest and moved over your heart. The transducer produces sound waves that travel to your heart and bounce back (or "echo" back) to the transducer. These sound waves will be captured in real-time and changed into images of your heart that can be viewed on a video monitor. The images will be recorded on a computer and reviewed by your health care provider.  You may be asked to change positions or hold your breath for a short time. This makes it easier to get different views or better views of your heart.  In some cases, you may receive contrast through an IV in one of your veins. This can improve the quality of the pictures from your heart. The procedure may vary among health care providers and hospitals.   What can I expect after the test? You may return to your normal, everyday life, including diet, activities, and medicines, unless your health care provider tells you not to do that. Follow these instructions at home:  It is up to you to get the results of your test. Ask your health care provider, or the department that is doing the test, when your results will be ready.  Keep all follow-up visits. This is important. Summary  An echocardiogram is a test that uses sound waves (ultrasound) to produce images of the heart.  Images from an echocardiogram can provide important information about the size and shape of your heart, heart muscle function, heart valve function, and other possible heart problems.  You do not need to do anything to prepare before this test. You may eat and drink normally.  After the echocardiogram is completed, you may return to your normal, everyday life, unless your health care provider tells you not to do that. This information is not intended to replace advice given to you by your health care provider. Make sure you discuss any questions you have with your health care provider. Document Revised:  10/28/2019 Document Reviewed: 10/28/2019 Elsevier Patient Education  2021 Elsevier Inc.   

## 2020-06-30 NOTE — Progress Notes (Signed)
Cardiology Office Note:    Date:  06/30/2020   ID:  [name], DOB 09/17/1955, MRN [id number]  PCP:  Olive Bass, MD  Cardiologist:  Gypsy Balsam, MD    Referring MD: Olive Bass, MD   Chief Complaint  Patient presents with  . Follow-up  Doing fine  History of Present Illness:    [name] is a 65 y.o. male with past medical history significant for paroxysmal atrial fibrillation have only 1 documented episode of atrial fibrillation.  His chads 2 vascular is 2.  He prefer not to be anticoagulated.  And obviously he is doing well without any recent palpitations.  Also have history of moderate aortic insufficiency, there is some questionable bicuspid aortic valve which I doubt there was also mild enlargement of the aortic root measuring 40 mm by echocardiogram however CT did not confirm that. Is coming today 2 months of follow-up overall doing well.  Exercise on the regular basis with no difficulties.  Denies having chest pain tightness squeezing pressure burning chest overall doing well.  Past Medical History:  Diagnosis Date  . Acute cystitis with hematuria 03/04/2019  . Arthritis    neck , ankles  . Atypical chest pain 02/24/2020  . Bicuspid aortic valve   . Bladder stone   . BPH (benign prostatic hyperplasia)   . Cervical disc disease   . Gross hematuria 03/25/2018   Formatting of this note might be different from the original. 2019, 2020  . Hematuria   . History of kidney stones   . Hypertension   . Mixed hyperlipidemia   . Nephrolithiasis   . Nocturia   . PAF (paroxysmal atrial fibrillation) (HCC) followed by pcp-- per pt never followed by cardiologist after 2017   per pt had AFib 2017,  had work-up stress test and echo done at [facility] at that time, no other work-up done since,   . Prediabetes 03/21/2016   Formatting of this note might be different from the original. 2018: 135/6.0  . Prostatic hyperplasia 01/15/2019  . Screening for  lipid disorders 03/21/2016  . Second degree Mobitz I AV block 11/11/2019  . Urine retention   . Wears glasses   . Wellness examination 03/21/2016    Past Surgical History:  Procedure Laterality Date  . bladder cauterization  01/16/2019  . COLONOSCOPY     x2  . CYSTOSCOPY WITH LITHOLAPAXY N/A 01/15/2019   Procedure: CYSTOSCOPY WITH LITHOLAPAXY;  Surgeon: Sebastian Ache, MD;  Location: [facility];  Service: Urology;  Laterality: N/A;  . CYSTOSCOPY WITH URETHRAL DILATATION N/A 05/02/2019   Procedure: CYSTOSCOPY WITH URETHRAL DILATATION;  Surgeon: Sebastian Ache, MD;  Location[facility];  Service: Urology;  Laterality: N/A;  45 MINS  . CYSTOSTOMY W/ RETROGRADE URETEROSCOPY  1994  . INGUINAL HERNIA REPAIR Right 2006  approx.  . TRANSURETHRAL RESECTION OF PROSTATE N/A 01/15/2019   Procedure: TRANSURETHRAL RESECTION OF THE PROSTATE (TURP);  Surgeon: Sebastian Ache, MD;  Location: [facility];  Service: Urology;  Laterality: N/A;    Current Medications: Current Meds  Medication Sig  . aspirin EC 81 MG tablet Take 81 mg by mouth daily.  Marland Kitchen atorvastatin (LIPITOR) 10 MG tablet Take 5 mg by mouth every morning.   . indapamide (LOZOL) 1.25 MG tablet Take 1.25 mg by mouth daily.     Allergies:   Ibuprofen   Social History   Socioeconomic History  . Marital status: Married    Spouse  name: Not on file  . Number of children: Not on file  . Years of education: Not on file  . Highest education level: Not on file  Occupational History  . Not on file  Tobacco Use  . Smoking status: Never Smoker  . Smokeless tobacco: Former Neurosurgeon    Types: Snuff, Chew  Vaping Use  . Vaping Use: Never used  Substance and Sexual Activity  . Alcohol use: Not Currently  . Drug use: Never  . Sexual activity: Not on file  Other Topics Concern  . Not on file  Social History Narrative  . Not on file   Social Determinants of Health   Financial Resource Strain:  Not on file  Food Insecurity: Not on file  Transportation Needs: Not on file  Physical Activity: Not on file  Stress: Not on file  Social Connections: Not on file     Family History: The patient's family history includes Aortic aneurysm in his mother; Atrial fibrillation in his sister; Diabetes in his father; Hypertension in his mother; Nephrolithiasis in his father; Pancreatic cancer in his father. ROS:   Please see the history of present illness.    All 14 point review of systems negative except as described per history of present illness  EKGs/Labs/Other Studies Reviewed:      Recent Labs: 03/18/2020: BUN 24; Creatinine, Ser 1.03; Potassium 3.9; Sodium 140  Recent Lipid Panel No results found for: CHOL, TRIG, HDL, CHOLHDL, VLDL, LDLCALC, LDLDIRECT  Physical Exam:    VS:  BP 110/72 (BP Location: Right Arm, Patient Position: Sitting, Cuff Size: Small)   Pulse 74   Ht 6\' 4"  (1.93 m)   Wt 189 lb (85.7 kg)   SpO2 95%   BMI 23.01 kg/m     Wt Readings from Last 3 Encounters:  06/30/20 189 lb (85.7 kg)  02/24/20 189 lb (85.7 kg)  11/11/19 188 lb (85.3 kg)     GEN:  Well nourished, well developed in no acute distress HEENT: Normal NECK: No JVD; No carotid bruits LYMPHATICS: No lymphadenopathy CARDIAC: RRR, soft diastolic murmur grade 2/6 best heard at apex and left border of the sternum, no rubs, no gallops RESPIRATORY:  Clear to auscultation without rales, wheezing or rhonchi  ABDOMEN: Soft, non-tender, non-distended MUSCULOSKELETAL:  No edema; No deformity  SKIN: Warm and dry LOWER EXTREMITIES: no swelling NEUROLOGIC:  Alert and oriented x 3 PSYCHIATRIC:  Normal affect   ASSESSMENT:    1. PAF (paroxysmal atrial fibrillation) (HCC)   2. Nonrheumatic aortic valve insufficiency   3. Mixed hyperlipidemia    PLAN:    In order of problems listed above:  1. Paroxysmal atrial fibrillation only 1 documented episodes of atrial fibrillation.  His chads 2 vascular is  questionable 2.  Now he is turning 65.  But since he does not have recurrences of atrial fibrillation prefer not to be anticoagulant which I will continue.  Also diagnosed of hypertension questionable, he does take metoprolol which obviously low blood pressure but if it is true hypertension is only very mild. 2. Aortic valve insufficiency.  We will repeat echocardiogram in about 6 to 12 months to recheck on the valve lesion. 3. Aortic root enlargement measuring 40 mm by echocardiogram however CT coronary did not mention anything about the lateral of the aortic root. 4. Mixed dyslipidemia, I did review the review laboratory data from his primary care physician showing LDL of 67 and HDL 69 this is an excellent cholesterol profile we will continue present  management.   Medication Adjustments/Labs and Tests Ordered: Current medicines are reviewed at length with the patient today.  Concerns regarding medicines are outlined above.  No orders of the defined types were placed in this encounter.  Medication changes: No orders of the defined types were placed in this encounter.   Signed, Georgeanna Lea, MD, [facility] 06/30/2020 8:19 AM    [facility]

## 2021-01-07 ENCOUNTER — Ambulatory Visit (INDEPENDENT_AMBULATORY_CARE_PROVIDER_SITE_OTHER): Payer: BC Managed Care – PPO

## 2021-01-07 ENCOUNTER — Other Ambulatory Visit: Payer: Self-pay

## 2021-01-07 DIAGNOSIS — I351 Nonrheumatic aortic (valve) insufficiency: Secondary | ICD-10-CM

## 2021-01-07 DIAGNOSIS — E782 Mixed hyperlipidemia: Secondary | ICD-10-CM

## 2021-01-07 DIAGNOSIS — I480 Paroxysmal atrial fibrillation: Secondary | ICD-10-CM | POA: Diagnosis not present

## 2021-01-07 LAB — ECHOCARDIOGRAM COMPLETE
Area-P 1/2: 2.97 cm2
P 1/2 time: 546 msec
S' Lateral: 3.2 cm

## 2021-02-07 ENCOUNTER — Telehealth: Payer: Self-pay | Admitting: Cardiology

## 2021-02-07 NOTE — Telephone Encounter (Signed)
Patient c/o Palpitations:  High priority if patient c/o lightheadedness, shortness of breath, or chest pain  How long have you had palpitations/irregular HR/ Afib? Are you having the symptoms now? Since Friday/ no   Are you currently experiencing lightheadedness, SOB or CP? no  Do you have a history of afib (atrial fibrillation) or irregular heart rhythm? One time in 2015  Have you checked your BP or HR? (document readings if available): 124/73 hr 99  Are you experiencing any other symptoms? no

## 2021-02-07 NOTE — Telephone Encounter (Signed)
Spoke to the patient just now and he tells me that he felt like he was in a-fib again on Friday night while he was out jogging in the neighborhood. At this point in time he got dizzy but does not think that his heart was racing out of the normal. He tells me that he felt like his heart was erratic though.   He states that his blood pressure last night was 124/73 and heart rate was 99 bpm.   He is not having any symptoms at this time and states that if he is not actively exercising he does not feel this way.   I will route to Dr. Bing Matter to advise. Patient is aware that he is out of the office and it will more then likely be tomorrow before we hear back on his recommendations.    Encouraged patient to call back with any questions or concerns.

## 2021-02-08 NOTE — Telephone Encounter (Signed)
Spoke to patient. He would like to wait a few days and decide about a monitor. He doesn't feel he is in atrial fibrillation anymore. He will let us know if he decides about monitor.

## 2021-07-21 ENCOUNTER — Ambulatory Visit: Payer: BC Managed Care – PPO | Admitting: Cardiology

## 2021-09-04 IMAGING — CT CT HEART MORP W/ CTA COR W/ SCORE W/ CA W/CM &/OR W/O CM
4 of 7 series · 8 of 20 positions shown, 9 images · IV contrast (APPLIED)
Comparison: None.
COMPARISON: None.

Addendum:
EXAM:
OVER-READ INTERPRETATION  CT CHEST

The following report is an over-read performed by radiologist Dr.
Iman Ruchiat Hoyaranda [REDACTED] on 03/23/2020. This
over-read does not include interpretation of cardiac or coronary
anatomy or pathology. The coronary calcium score/coronary CTA
interpretation by the cardiologist is attached.
TECHNIQUE: The patient was scanned on a [employer] Force scanner.

[Series 7: best diast 73 % · axial · 0.39mm/px · z∈[-182,-135]mm · 2 of 351 slices shown]
[im 117/351  vessel]
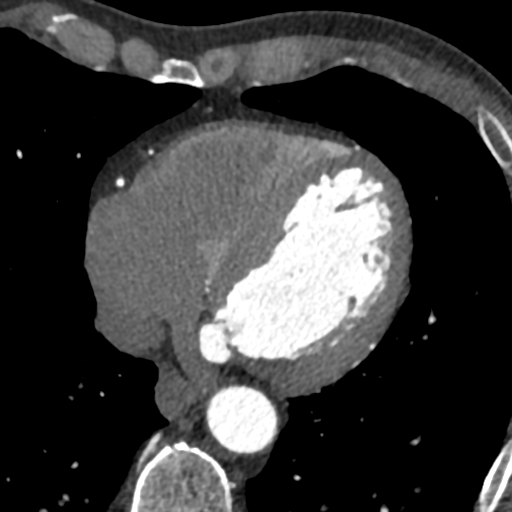
[im 234/351  vessel]
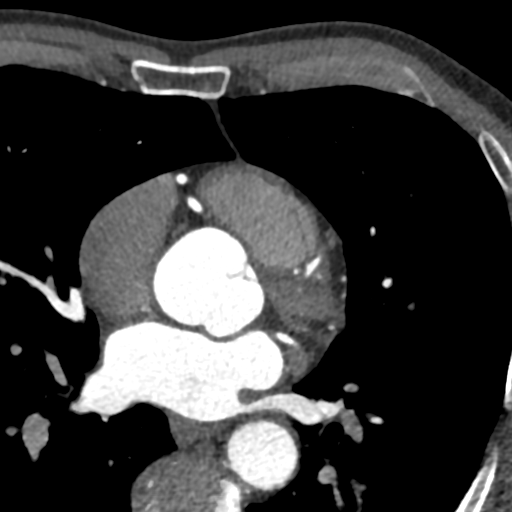

[Series 8: best syst · axial · 0.39mm/px · z∈[-182,-135]mm · 2 of 351 slices shown, 3 images]
[im 117/351  vessel]
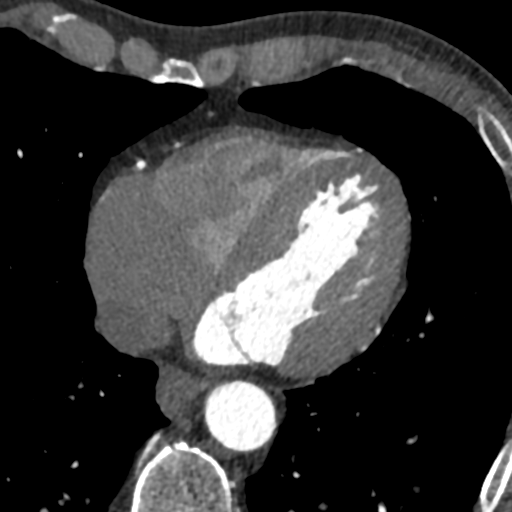
[im 117/351  lung]
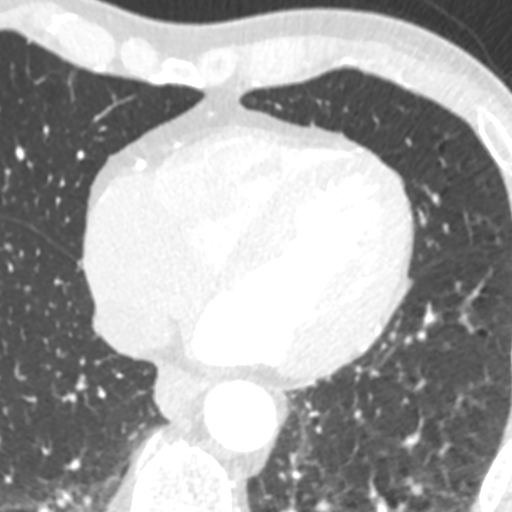
[im 234/351  vessel]
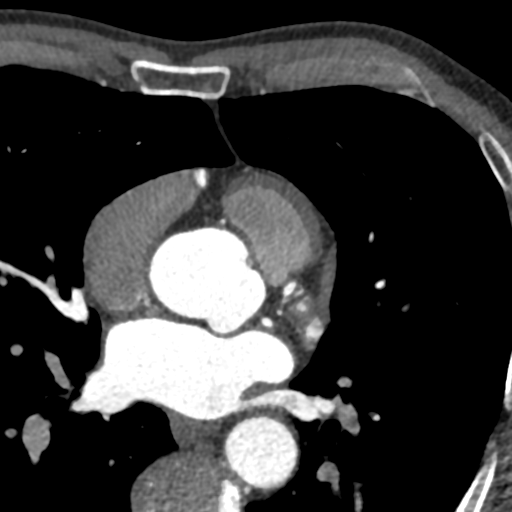

[Series 10: ts diast sharp 73 % · axial · 0.39mm/px · z∈[-182,-135]mm · 2 of 351 slices shown]
[im 117/351  lung]
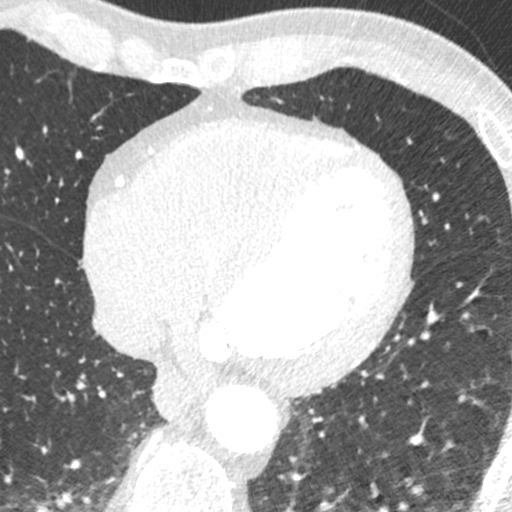
[im 234/351  lung]
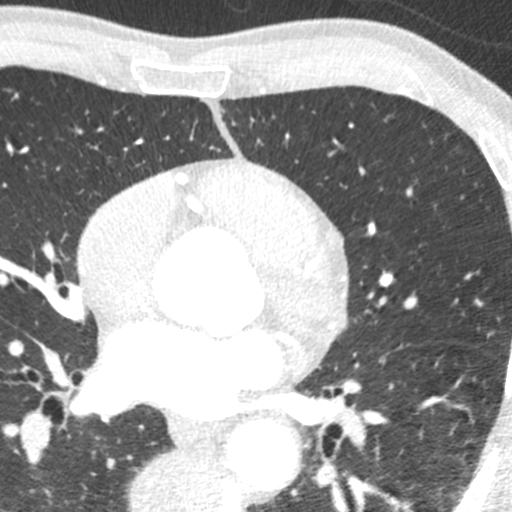

[Series 11: ts syst sharp · axial · 0.39mm/px · z∈[-182,-135]mm · 2 of 351 slices shown]
[im 117/351  lung]
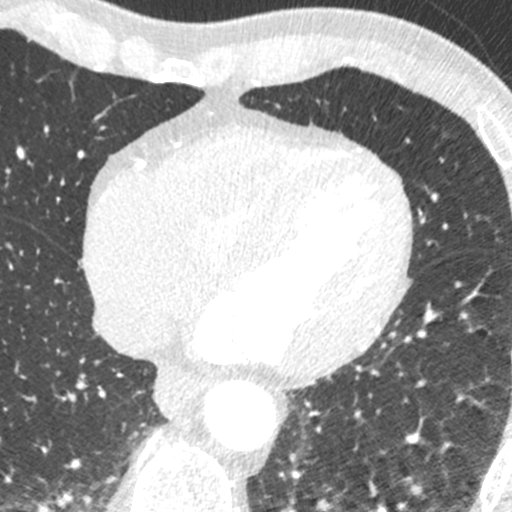
[im 234/351  lung]
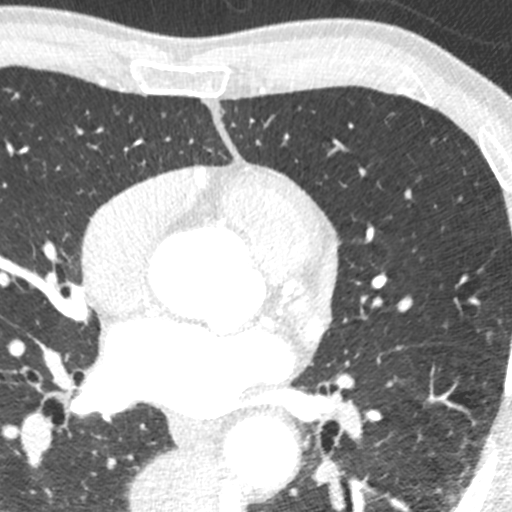

[8 of 20 positions shown; findings below may reference images not displayed]

FINDINGS: Within the visualized portions of the thorax there are no suspicious
appearing pulmonary nodules or masses, there is no acute
consolidative airspace disease, no pleural effusions, no
pneumothorax and no lymphadenopathy. Visualized portions of the
upper abdomen are unremarkable. There are no aggressive appearing
lytic or blastic lesions noted in the visualized portions of the
skeleton.
IMPRESSION: No significant incidental noncardiac findings are noted.

EXAM:
Cardiac/Coronary  CT
FINDINGS: A 120 kV prospective scan was triggered in the descending thoracic
aorta at 111 HU's. Axial non-contrast 3 mm slices were carried out
through the heart. The data set was analyzed on a dedicated work
station and scored using the Agatson method. Gantry rotation speed
was 250 msecs and collimation was .6 mm. No beta blockade and 0.8 mg
of sl NTG was given. The 3D data set was reconstructed in 5%
intervals of the 67-82 % of the R-R cycle. Diastolic phases were
analyzed on a dedicated work station using MPR, MIP and VRT modes.
The patient received 80 cc of contrast.

Aorta:  Normal size.  No calcifications.  No dissection.

Aortic Valve:  Trileaflet.  No calcifications.

Coronary Arteries:  Normal coronary origin.  Right dominance.

RCA is a large dominant artery that gives rise to PDA and PLVB.
There is no plaque.

Left main is a large artery that gives rise to LAD, ramus and LCX
arteries. There is no plaque.

LAD is a large vessel that gives rise to a small D1 (poorly
visualized), and moderate sized D2 and D3. There is no plaque.

LCX is a non-dominant artery that gives rise to one large OM1
branch. There is no plaque.

Other findings:

Normal pulmonary vein drainage into the left atrium.

Normal let atrial appendage without a thrombus.

Normal size of the pulmonary artery.

Small PFO.
IMPRESSION: 1. Coronary calcium score of 0. This was 0 percentile for age and
sex matched control.

2.  Normal coronary origin with right dominance.

3.  No evidence of CAD.  CAD RADS 0.

4.  Consider non atheroslcerotic causes of chest pain.

Gen [place]ad

*** End of Addendum ***
EXAM:
OVER-READ INTERPRETATION  CT CHEST

The following report is an over-read performed by radiologist Dr.
Iman Ruchiat Hoyaranda [REDACTED] on 03/23/2020. This
over-read does not include interpretation of cardiac or coronary
anatomy or pathology. The coronary calcium score/coronary CTA
interpretation by the cardiologist is attached.
FINDINGS: Within the visualized portions of the thorax there are no suspicious
appearing pulmonary nodules or masses, there is no acute
consolidative airspace disease, no pleural effusions, no
pneumothorax and no lymphadenopathy. Visualized portions of the
upper abdomen are unremarkable. There are no aggressive appearing
lytic or blastic lesions noted in the visualized portions of the
skeleton.
IMPRESSION: No significant incidental noncardiac findings are noted.

## 2021-09-16 ENCOUNTER — Encounter: Payer: Self-pay | Admitting: Cardiology

## 2021-09-16 ENCOUNTER — Ambulatory Visit: Payer: BC Managed Care – PPO | Admitting: Cardiology

## 2021-09-16 VITALS — BP 120/84 | HR 81 | Ht 76.0 in | Wt 190.4 lb

## 2021-09-16 DIAGNOSIS — I10 Essential (primary) hypertension: Secondary | ICD-10-CM

## 2021-09-16 DIAGNOSIS — R0789 Other chest pain: Secondary | ICD-10-CM | POA: Diagnosis not present

## 2021-09-16 DIAGNOSIS — E782 Mixed hyperlipidemia: Secondary | ICD-10-CM

## 2021-09-16 DIAGNOSIS — I480 Paroxysmal atrial fibrillation: Secondary | ICD-10-CM

## 2021-09-16 NOTE — Patient Instructions (Signed)

## 2021-09-16 NOTE — Progress Notes (Signed)
Cardiology Office Note:    Date:  09/16/2021   ID:  [name], DOB 1955-08-03, MRN [id number]  PCP:  Olive Bass, MD  Cardiologist:  Gypsy Balsam, MD    Referring MD: Olive Bass, MD   Chief Complaint  Patient presents with   Follow-up    History of Present Illness:    [name] is a 66 y.o. male with past medical history significant for paroxysmal atrial fibrillation 1 documented episode, aortic insufficiency which is mild questionable bicuspid arctic valve, dyslipidemia.  Comes today to my office for follow-up overall doing very well denies have any palpitations, he does have cardia device check his EKG from time to time never recorded any arrhythmia.  Exercise on the regular basis doing well overall  Past Medical History:  Diagnosis Date   Acute cystitis with hematuria 03/04/2019   Arthritis    neck , ankles   Atypical chest pain 02/24/2020   Bicuspid aortic valve    Bladder stone    BPH (benign prostatic hyperplasia)    Cervical disc disease    Gross hematuria 03/25/2018   Formatting of this note might be different from the original. 2019, 2020   Hematuria    History of kidney stones    Hypertension    Mixed hyperlipidemia    Nephrolithiasis    Nocturia    PAF (paroxysmal atrial fibrillation) (HCC) followed by pcp-- per pt never followed by cardiologist after 2017   per pt had AFib 2017,  had work-up stress test and echo done at [facility] at that time, no other work-up done since,    Prediabetes 03/21/2016   Formatting of this note might be different from the original. 2018: 135/6.0   Prostatic hyperplasia 01/15/2019   Screening for lipid disorders 03/21/2016   Second degree Mobitz I AV block 11/11/2019   Urine retention    Wears glasses    Wellness examination 03/21/2016    Past Surgical History:  Procedure Laterality Date   bladder cauterization  01/16/2019   COLONOSCOPY     x2   CYSTOSCOPY WITH LITHOLAPAXY N/A 01/15/2019    Procedure: CYSTOSCOPY WITH LITHOLAPAXY;  Surgeon: Sebastian Ache, MD;  Location: [facility];  Service: Urology;  Laterality: N/A;   CYSTOSCOPY WITH URETHRAL DILATATION N/A 05/02/2019   Procedure: CYSTOSCOPY WITH URETHRAL DILATATION;  Surgeon: Sebastian Ache, MD;  Location: [facility];  Service: Urology;  Laterality: N/A;  45 MINS   CYSTOSTOMY W/ RETROGRADE URETEROSCOPY  1994   INGUINAL HERNIA REPAIR Right 2006  approx.   TRANSURETHRAL RESECTION OF PROSTATE N/A 01/15/2019   Procedure: TRANSURETHRAL RESECTION OF THE PROSTATE (TURP);  Surgeon: Sebastian Ache, MD;  Location: [facility];  Service: Urology;  Laterality: N/A;    Current Medications: Current Meds  Medication Sig   aspirin EC 81 MG tablet Take 81 mg by mouth daily.   atorvastatin (LIPITOR) 10 MG tablet Take 5 mg by mouth every morning.    indapamide (LOZOL) 1.25 MG tablet Take 1.25 mg by mouth daily.     Allergies:   Ibuprofen   Social History   Socioeconomic History   Marital status: Married    Spouse name: Not on file   Number of children: Not on file   Years of education: Not on file   Highest education level: Not on file  Occupational History   Not on file  Tobacco Use   Smoking status: Never   Smokeless tobacco: Former  Types: Snuff, Chew    Quit date: 01/13/1974  Vaping Use   Vaping Use: Never used  Substance and Sexual Activity   Alcohol use: Not Currently   Drug use: Never   Sexual activity: Not on file  Other Topics Concern   Not on file  Social History Narrative   Not on file   Social Determinants of Health   Financial Resource Strain: Not on file  Food Insecurity: Not on file  Transportation Needs: Not on file  Physical Activity: Not on file  Stress: Not on file  Social Connections: Not on file     Family History: The patient's family history includes Aortic aneurysm in his mother; Atrial fibrillation in his sister; Diabetes in his father;  Hypertension in his mother; Nephrolithiasis in his father; Pancreatic cancer in his father. ROS:   Please see the history of present illness.    All 14 point review of systems negative except as described per history of present illness  EKGs/Labs/Other Studies Reviewed:      Recent Labs: No results found for requested labs within last 365 days.  Recent Lipid Panel No results found for: "CHOL", "TRIG", "HDL", "CHOLHDL", "VLDL", "LDLCALC", "LDLDIRECT"  Physical Exam:    VS:  BP 120/84 (BP Location: Left Arm, Patient Position: Sitting)   Pulse 81   Ht 6\' 4"  (1.93 m)   Wt 190 lb 6.4 oz (86.4 kg)   SpO2 94%   BMI 23.18 kg/m     Wt Readings from Last 3 Encounters:  09/16/21 190 lb 6.4 oz (86.4 kg)  06/30/20 189 lb (85.7 kg)  02/24/20 189 lb (85.7 kg)     GEN:  Well nourished, well developed in no acute distress HEENT: Normal NECK: No JVD; No carotid bruits LYMPHATICS: No lymphadenopathy CARDIAC: RRR, no murmurs, no rubs, no gallops RESPIRATORY:  Clear to auscultation without rales, wheezing or rhonchi  ABDOMEN: Soft, non-tender, non-distended MUSCULOSKELETAL:  No edema; No deformity  SKIN: Warm and dry LOWER EXTREMITIES: no swelling NEUROLOGIC:  Alert and oriented x 3 PSYCHIATRIC:  Normal affect   ASSESSMENT:    1. PAF (paroxysmal atrial fibrillation) (HCC)   2. Primary hypertension   3. Mixed hyperlipidemia   4. Atypical chest pain    PLAN:    In order of problems listed above:  Paroxysmal atrial fibrillation: Only 1 documented episode CHADS2 vascular "2 does not want to be anticoagulated and likely does not have an episode continue present management Essential hypertension blood pressure well controlled continue present management Mixed dyslipidemia followed by primary care physician Atypical chest pain denies having any, coronary CT angio negative   Medication Adjustments/Labs and Tests Ordered: Current medicines are reviewed at length with the patient today.   Concerns regarding medicines are outlined above.  Orders Placed This Encounter  Procedures   EKG 12-Lead   Medication changes: No orders of the defined types were placed in this encounter.   Signed, 14/07/21, MD, [facility] 09/16/2021 4:29 PM    [facility]

## 2022-03-04 DIAGNOSIS — I4891 Unspecified atrial fibrillation: Secondary | ICD-10-CM

## 2022-03-04 DIAGNOSIS — Q231 Congenital insufficiency of aortic valve: Secondary | ICD-10-CM | POA: Diagnosis not present

## 2022-03-04 DIAGNOSIS — I351 Nonrheumatic aortic (valve) insufficiency: Secondary | ICD-10-CM

## 2022-03-06 ENCOUNTER — Telehealth: Payer: Self-pay | Admitting: Cardiology

## 2022-03-06 NOTE — Telephone Encounter (Signed)
Patient states he saw Dr. Bing Matter in the hospital and he advised him to schedule an appointment with him ASAP. Soonest in [facility] is late January and soonest in [place] is in April. Patient insists that he must be seen sooner.

## 2022-03-06 NOTE — Telephone Encounter (Signed)
Patient asked for an appt in [place] and schedule on Wednesday.

## 2022-03-08 ENCOUNTER — Ambulatory Visit: Payer: BC Managed Care – PPO | Attending: Cardiology | Admitting: Cardiology

## 2022-03-08 ENCOUNTER — Encounter: Payer: Self-pay | Admitting: Cardiology

## 2022-03-08 VITALS — BP 104/76 | HR 80 | Ht 76.0 in | Wt 190.0 lb

## 2022-03-08 DIAGNOSIS — I351 Nonrheumatic aortic (valve) insufficiency: Secondary | ICD-10-CM

## 2022-03-08 DIAGNOSIS — E782 Mixed hyperlipidemia: Secondary | ICD-10-CM | POA: Diagnosis not present

## 2022-03-08 DIAGNOSIS — I480 Paroxysmal atrial fibrillation: Secondary | ICD-10-CM | POA: Diagnosis not present

## 2022-03-08 MED ORDER — METOPROLOL SUCCINATE ER 25 MG PO TB24: 25.0000 mg | ORAL_TABLET | Freq: Every day | ORAL | 3 refills | Status: DC

## 2022-03-08 MED ORDER — FLECAINIDE ACETATE 50 MG PO TABS: 50.0000 mg | ORAL_TABLET | Freq: Two times a day (BID) | ORAL | 3 refills | Status: DC

## 2022-03-08 NOTE — Patient Instructions (Addendum)
Medication Instructions:  Your physician has recommended you make the following change in your medication:   Stop Aspirin  Stop Lopressor (metoprolol tartrate)  Start Toprol XL 25 mg daily  Start Flecainide 50 mg every 12 hours.   Come Friday for a nurse visit/EKG  *If you need a refill on your cardiac medications before your next appointment, please call your pharmacy*   Lab Work: None ordered If you have labs (blood work) drawn today and your tests are completely normal, you will receive your results only by: MyChart Message (if you have MyChart) OR A paper copy in the mail If you have any lab test that is abnormal or we need to change your treatment, we will call you to review the results.   Testing/Procedures: None ordered   Follow-Up: At [facility], you and your health needs are our priority.  As part of our continuing mission to provide you with exceptional heart care, we have created designated Provider Care Teams.  These Care Teams include your primary Cardiologist (physician) and Advanced Practice Providers (APPs -  Physician Assistants and Nurse Practitioners) who all work together to provide you with the care you need, when you need it.  We recommend signing up for the patient portal called "MyChart".  Sign up information is provided on this After Visit Summary.  MyChart is used to connect with patients for Virtual Visits (Telemedicine).  Patients are able to view lab/test results, encounter notes, upcoming appointments, etc.  Non-urgent messages can be sent to your provider as well.   To learn more about what you can do with MyChart, go to [email or web address].    Your next appointment:   2 weeks  The format for your next appointment:   In Person  Provider:   Gypsy Balsam, MD    Other Instructions none  Important Information About Sugar

## 2022-03-08 NOTE — Progress Notes (Signed)
Cardiology Office Note:    Date:  03/08/2022   ID:  [name], DOB 1955-08-24, MRN [id number]  PCP:  Olive Bass, MD  Cardiologist:  Garwin Brothers, MD   Referring MD: Olive Bass, MD    ASSESSMENT:    1. PAF (paroxysmal atrial fibrillation) (HCC)   2. Aortic valve insufficiency, etiology of cardiac valve disease unspecified   3. Mixed hyperlipidemia    PLAN:    In order of problems listed above:  Primary prevention stressed with the patient.  Importance of compliance with diet medication stressed and vocalized understanding. Paroxysmal atrial fibrillation:I discussed with the patient atrial fibrillation, disease process. Management and therapy including rate and rhythm control, anticoagulation benefits and potential risks were discussed extensively with the patient. Patient had multiple questions which were answered to patient's satisfaction.  Patient is currently on anticoagulation.  Also I we will start him on flecainide.  His calcium score and CT coronary angiography was fine in January 2022.  Benefits and potential is explained.  I initiated him on flecainide 50 mg every 12 hours.  I cut down Toprol to Toprol-XL 25 mg in the morning and he is agreeable.  He will be back on Friday for an EKG. Essential hypertension: Blood pressure stable Mixed dyslipidemia: On lipid-lowering medications but this will have to be reviewed by Dr. Bing Matter and his primary care as his calcium score is 0.  I will leave it up to them.  Diet was emphasized. He will be seen in follow-up appointment in 4 weeks per Dr. Bing Matter or earlier if he has any concerns.  Questions were answered to his satisfaction.  The above medications and benefits risks explained.   Medication Adjustments/Labs and Tests Ordered: Current medicines are reviewed at length with the patient today.  Concerns regarding medicines are outlined above.  Orders Placed This Encounter  Procedures   EKG 12-Lead   Meds  ordered this encounter  Medications   flecainide (TAMBOCOR) 50 MG tablet    Sig: Take 1 tablet (50 mg total) by mouth 2 (two) times daily.    Dispense:  180 tablet    Refill:  3   metoprolol succinate (TOPROL XL) 25 MG 24 hr tablet    Sig: Take 1 tablet (25 mg total) by mouth daily.    Dispense:  90 tablet    Refill:  3     Chief Complaint  Patient presents with   Hospitalization Follow-up     History of Present Illness:    [name] is a 66 y.o. male.  Patient has past medical history of paroxysmal atrial fibrillation, aortic insufficiency and mixed dyslipidemia.  He saw Dr. Bing Matter in the past and was scheduled for an appointment with him but Dr. Bing Matter had an emergency to attend at the hospital so I was called to evaluate him.  I reviewed [facility] hospital records he has had episodes of paroxysmal atrial fibrillation and continues to have them especially when he walks uphill.  Fortunately his calcium score and CT coronary angiography and January 2022 was unremarkable.  He denies any chest pain orthopnea or PND or passing out spells.  At the time of my evaluation, the patient is alert awake oriented and in no distress.  Past Medical History:  Diagnosis Date   Acute cystitis with hematuria 03/04/2019   Arthritis    neck , ankles   Atypical chest pain 02/24/2020   Bicuspid aortic valve    Bladder stone  BPH (benign prostatic hyperplasia)    Cervical disc disease    Gross hematuria 03/25/2018   Formatting of this note might be different from the original. 2019, 2020   Hematuria    History of kidney stones    Hypertension    Mixed hyperlipidemia    Nephrolithiasis    Nocturia    PAF (paroxysmal atrial fibrillation) (HCC) followed by pcp-- per pt never followed by cardiologist after 2017   per pt had AFib 2017,  had work-up stress test and echo done at [facility] at that time, no other work-up done since,    Prediabetes 03/21/2016   Formatting of this note  might be different from the original. 2018: 135/6.0   Prostatic hyperplasia 01/15/2019   Screening for lipid disorders 03/21/2016   Second degree Mobitz I AV block 11/11/2019   Urine retention    Wears glasses    Wellness examination 03/21/2016    Past Surgical History:  Procedure Laterality Date   bladder cauterization  01/16/2019   COLONOSCOPY     x2   CYSTOSCOPY WITH LITHOLAPAXY N/A 01/15/2019   Procedure: CYSTOSCOPY WITH LITHOLAPAXY;  Surgeon: Sebastian Ache, MD;  Location: [facility];  Service: Urology;  Laterality: N/A;   CYSTOSCOPY WITH URETHRAL DILATATION N/A 05/02/2019   Procedure: CYSTOSCOPY WITH URETHRAL DILATATION;  Surgeon: Sebastian Ache, MD;  Location: [facility];  Service: Urology;  Laterality: N/A;  45 MINS   CYSTOSTOMY W/ RETROGRADE URETEROSCOPY  1994   INGUINAL HERNIA REPAIR Right 2006  approx.   TRANSURETHRAL RESECTION OF PROSTATE N/A 01/15/2019   Procedure: TRANSURETHRAL RESECTION OF THE PROSTATE (TURP);  Surgeon: Sebastian Ache, MD;  Location: [facility];  Service: Urology;  Laterality: N/A;    Current Medications: Current Meds  Medication Sig   atorvastatin (LIPITOR) 10 MG tablet Take 5 mg by mouth every morning.    ELIQUIS 5 MG TABS tablet Take 5 mg by mouth 2 (two) times daily.   flecainide (TAMBOCOR) 50 MG tablet Take 1 tablet (50 mg total) by mouth 2 (two) times daily.   indapamide (LOZOL) 1.25 MG tablet Take 1.25 mg by mouth daily.   metoprolol succinate (TOPROL XL) 25 MG 24 hr tablet Take 1 tablet (25 mg total) by mouth daily.   [DISCONTINUED] aspirin EC 81 MG tablet Take 81 mg by mouth daily.   [DISCONTINUED] metoprolol tartrate (LOPRESSOR) 25 MG tablet Take 25 mg by mouth 2 (two) times daily.     Allergies:   Ibuprofen   Social History   Socioeconomic History   Marital status: Married    Spouse name: Not on file   Number of children: Not on file   Years of education: Not on file   Highest  education level: Not on file  Occupational History   Not on file  Tobacco Use   Smoking status: Never   Smokeless tobacco: Former    Types: Snuff, Dorna Bloom    Quit date: 01/13/1974  Vaping Use   Vaping Use: Never used  Substance and Sexual Activity   Alcohol use: Not Currently   Drug use: Never   Sexual activity: Not on file  Other Topics Concern   Not on file  Social History Narrative   Not on file   Social Determinants of Health   Financial Resource Strain: Not on file  Food Insecurity: Not on file  Transportation Needs: Not on file  Physical Activity: Not on file  Stress: Not on file  Social Connections:  Not on file     Family History: The patient's family history includes Aortic aneurysm in his mother; Atrial fibrillation in his sister; Diabetes in his father; Hypertension in his mother; Nephrolithiasis in his father; Pancreatic cancer in his father.  ROS:   Please see the history of present illness.    All other systems reviewed and are negative.  EKGs/Labs/Other Studies Reviewed:    The following studies were reviewed today: EKG was sinus rhythm and nonspecific ST-T changes   Recent Labs: No results found for requested labs within last 365 days.  Recent Lipid Panel No results found for: "CHOL", "TRIG", "HDL", "CHOLHDL", "VLDL", "LDLCALC", "LDLDIRECT"  Physical Exam:    VS:  BP 104/76 (BP Location: Right Arm, Patient Position: Sitting, Cuff Size: Normal)   Pulse 80   Ht 6\' 4"  (1.93 m)   Wt 190 lb (86.2 kg)   SpO2 99%   BMI 23.13 kg/m     Wt Readings from Last 3 Encounters:  03/08/22 190 lb (86.2 kg)  09/16/21 190 lb 6.4 oz (86.4 kg)  06/30/20 189 lb (85.7 kg)     GEN: Patient is in no acute distress HEENT: Normal NECK: No JVD; No carotid bruits LYMPHATICS: No lymphadenopathy CARDIAC: Hear sounds regular, 2/6 systolic murmur at the apex. RESPIRATORY:  Clear to auscultation without rales, wheezing or rhonchi  ABDOMEN: Soft, non-tender,  non-distended MUSCULOSKELETAL:  No edema; No deformity  SKIN: Warm and dry NEUROLOGIC:  Alert and oriented x 3 PSYCHIATRIC:  Normal affect   Signed, 07/02/20, MD  03/08/2022 5:11 PM    [facility]

## 2022-03-10 ENCOUNTER — Ambulatory Visit: Payer: BC Managed Care – PPO | Attending: Cardiology

## 2022-03-10 ENCOUNTER — Telehealth: Payer: Self-pay

## 2022-03-10 DIAGNOSIS — Z5181 Encounter for therapeutic drug level monitoring: Secondary | ICD-10-CM | POA: Diagnosis not present

## 2022-03-10 NOTE — Telephone Encounter (Signed)
Spoke with pt. Advised per Dr. Leisa Lenz note to stay on same medications and come back in 1 week for vitals and EKG.

## 2022-03-10 NOTE — Progress Notes (Signed)
   Nurse Visit   Date of Encounter: 03/10/2022 ID: CUAHUTEMOC ATTAR, DOB [birth year], MRN [id number]  PCP:  Olive Bass, MD   [facility] Providers Cardiologist:  None      Visit Details   VS:  There were no vitals taken for this visit. , BMI There is no height or weight on file to calculate BMI.  Wt Readings from Last 3 Encounters:  03/08/22 190 lb (86.2 kg)  09/16/21 190 lb 6.4 oz (86.4 kg)  06/30/20 189 lb (85.7 kg)     Reason for visit: EKG to follow up on Flecainide Performed today:   , Vitals, EKG, Provider consulted:Dr. Revankar, and Education Changes (medications, testing, etc.) : No changes Length of Visit: 15 minutes    Medications Adjustments/Labs and Tests Ordered: No orders of the defined types were placed in this encounter.  No orders of the defined types were placed in this encounter.    Signed, Neena Rhymes, RN  03/10/2022 9:37 AM

## 2022-03-17 ENCOUNTER — Other Ambulatory Visit (INDEPENDENT_AMBULATORY_CARE_PROVIDER_SITE_OTHER): Payer: BC Managed Care – PPO

## 2022-03-17 ENCOUNTER — Ambulatory Visit: Payer: BC Managed Care – PPO

## 2022-03-17 ENCOUNTER — Ambulatory Visit: Payer: BC Managed Care – PPO | Attending: Cardiology

## 2022-03-17 DIAGNOSIS — I480 Paroxysmal atrial fibrillation: Secondary | ICD-10-CM

## 2022-03-17 NOTE — Progress Notes (Signed)
   Nurse Visit   Date of Encounter: 03/17/2022 ID: TRENDEN HAZELRIGG, DOB 1956/02/26, MRN [id number]  PCP:  Olive Bass, MD   [facility] Providers Cardiologist:  None      Visit Details   VS:  BP 120/76 (BP Location: Right Arm, Patient Position: Sitting, Cuff Size: Normal)   Pulse 63  , BMI There is no height or weight on file to calculate BMI.  Wt Readings from Last 3 Encounters:  03/10/22 193 lb (87.5 kg)  03/08/22 190 lb (86.2 kg)  09/16/21 190 lb 6.4 oz (86.4 kg)     Reason for visit: Perform EKG after patient started on Flecanide Performed today: EKG, Vitals, Provider consulted and Education Changes (medications, testing, etc.) : No new orders at this time. Length of Visit: 20 minutes    Medications Adjustments/Labs and Tests Ordered: No orders of the defined types were placed in this encounter.  No orders of the defined types were placed in this encounter.    Signed, Samson Frederic, RN  03/17/2022 3:24 PM

## 2022-03-28 ENCOUNTER — Encounter: Payer: Self-pay | Admitting: Cardiology

## 2022-03-28 ENCOUNTER — Ambulatory Visit: Payer: BC Managed Care – PPO | Attending: Cardiology | Admitting: Cardiology

## 2022-03-28 VITALS — BP 120/80 | HR 61 | Ht 76.0 in | Wt 189.0 lb

## 2022-03-28 DIAGNOSIS — Q231 Congenital insufficiency of aortic valve: Secondary | ICD-10-CM | POA: Diagnosis not present

## 2022-03-28 DIAGNOSIS — I480 Paroxysmal atrial fibrillation: Secondary | ICD-10-CM | POA: Diagnosis not present

## 2022-03-28 DIAGNOSIS — I10 Essential (primary) hypertension: Secondary | ICD-10-CM | POA: Insufficient documentation

## 2022-03-28 DIAGNOSIS — R7303 Prediabetes: Secondary | ICD-10-CM | POA: Insufficient documentation

## 2022-03-28 NOTE — Progress Notes (Signed)
Cardiology Office Note:    Date:  03/28/2022   ID:  [name], DOB 1955/08/20, MRN [id number]  PCP:  Algis Greenhouse, MD  Cardiologist:  Jenne Campus, MD    Referring MD: Algis Greenhouse, MD   Chief Complaint  Patient presents with   Follow-up  Doing fine  History of Present Illness:    [name] is a 67 y.o. male with past medical history significant for paroxysmal atrial fibrillation, his CHADS2 vascular equals 3, because of his age, hypertension borderline diabetes.  He is anticoagulated with Eliquis.  Recently he was complaining of having palpitations, flecainide has been initiated.  Tolerating well denies having chest pain tightness squeezing pressure burning chest no palpitation dizziness.  Past Medical History:  Diagnosis Date   Acute cystitis with hematuria 03/04/2019   Arthritis    neck , ankles   Atypical chest pain 02/24/2020   Bicuspid aortic valve    Bladder stone    BPH (benign prostatic hyperplasia)    Cervical disc disease    Gross hematuria 03/25/2018   Formatting of this note might be different from the original. 2019, 2020   Hematuria    History of kidney stones    Hypertension    Mixed hyperlipidemia    Nephrolithiasis    Nocturia    PAF (paroxysmal atrial fibrillation) ([place]) followed by pcp-- per pt never followed by cardiologist after 2017   per pt had AFib 2017,  had work-up stress test and echo done at [facility] at that time, no other work-up done since,    Prediabetes 03/21/2016   Formatting of this note might be different from the original. 2018: 135/6.0   Prostatic hyperplasia 01/15/2019   Screening for lipid disorders 03/21/2016   Second degree Mobitz I AV block 11/11/2019   Urine retention    Wears glasses    Wellness examination 03/21/2016    Past Surgical History:  Procedure Laterality Date   bladder cauterization  01/16/2019   COLONOSCOPY     x2   CYSTOSCOPY WITH LITHOLAPAXY N/A 01/15/2019   Procedure:  CYSTOSCOPY WITH LITHOLAPAXY;  Surgeon: Alexis Frock, MD;  Location: [facility];  Service: Urology;  Laterality: N/A;   CYSTOSCOPY WITH URETHRAL DILATATION N/A 05/02/2019   Procedure: CYSTOSCOPY WITH URETHRAL DILATATION;  Surgeon: Alexis Frock, MD;  Location: [facility];  Service: Urology;  Laterality: N/A;  67 MINS   CYSTOSTOMY W/ RETROGRADE URETEROSCOPY  1994   INGUINAL HERNIA REPAIR Right 2006  approx.   TRANSURETHRAL RESECTION OF PROSTATE N/A 01/15/2019   Procedure: TRANSURETHRAL RESECTION OF THE PROSTATE (TURP);  Surgeon: Alexis Frock, MD;  Location: [facility];  Service: Urology;  Laterality: N/A;    Current Medications: Current Meds  Medication Sig   atorvastatin (LIPITOR) 10 MG tablet Take 5 mg by mouth every morning.    ELIQUIS 5 MG TABS tablet Take 5 mg by mouth 2 (two) times daily.   flecainide (TAMBOCOR) 50 MG tablet Take 1 tablet (50 mg total) by mouth 2 (two) times daily.   indapamide (LOZOL) 1.25 MG tablet Take 1.25 mg by mouth daily.   metoprolol succinate (TOPROL XL) 25 MG 24 hr tablet Take 1 tablet (25 mg total) by mouth daily.     Allergies:   Ibuprofen   Social History   Socioeconomic History   Marital status: Married    Spouse name: Not on file   Number of children: Not on file   Years of  education: Not on file   Highest education level: Not on file  Occupational History   Not on file  Tobacco Use   Smoking status: Never   Smokeless tobacco: Former    Types: Snuff, Dorna Bloom    Quit date: 01/13/1974  Vaping Use   Vaping Use: Never used  Substance and Sexual Activity   Alcohol use: Not Currently   Drug use: Never   Sexual activity: Not on file  Other Topics Concern   Not on file  Social History Narrative   Not on file   Social Determinants of Health   Financial Resource Strain: Not on file  Food Insecurity: Not on file  Transportation Needs: Not on file  Physical Activity: Not on file  Stress:  Not on file  Social Connections: Not on file     Family History: The patient's family history includes Aortic aneurysm in his mother; Atrial fibrillation in his sister; Diabetes in his father; Hypertension in his mother; Nephrolithiasis in his father; Pancreatic cancer in his father. ROS:   Please see the history of present illness.    All 14 point review of systems negative except as described per history of present illness  EKGs/Labs/Other Studies Reviewed:      Recent Labs: No results found for requested labs within last 365 days.  Recent Lipid Panel No results found for: "CHOL", "TRIG", "HDL", "CHOLHDL", "VLDL", "LDLCALC", "LDLDIRECT"  Physical Exam:    VS:  BP 120/80 (BP Location: Left Arm, Patient Position: Sitting, Cuff Size: Normal)   Pulse 61   Ht 6\' 4"  (1.93 m)   Wt 189 lb (85.7 kg)   SpO2 98%   BMI 23.01 kg/m     Wt Readings from Last 3 Encounters:  03/28/22 189 lb (85.7 kg)  03/10/22 193 lb (87.5 kg)  03/08/22 190 lb (86.2 kg)     GEN:  Well nourished, well developed in no acute distress HEENT: Normal NECK: No JVD; No carotid bruits LYMPHATICS: No lymphadenopathy CARDIAC: RRR, no murmurs, no rubs, no gallops RESPIRATORY:  Clear to auscultation without rales, wheezing or rhonchi  ABDOMEN: Soft, non-tender, non-distended MUSCULOSKELETAL:  No edema; No deformity  SKIN: Warm and dry LOWER EXTREMITIES: no swelling NEUROLOGIC:  Alert and oriented x 3 PSYCHIATRIC:  Normal affect   ASSESSMENT:    1. PAF (paroxysmal atrial fibrillation) (HCC)   2. Primary hypertension   3. Bicuspid aortic valve   4. Prediabetes    PLAN:    In order of problems listed above:  Paroxysmal atrial fibrillation maintained sinus rhythm doing well continue present management. Essential hypertension blood pressure well-controlled continue present management. Aortic valve problem but no significant regurgitation no significant stenosis.  Next time I will order  echocardiogram. Prediabetes.  He is being followed by internal medicine team.   Medication Adjustments/Labs and Tests Ordered: Current medicines are reviewed at length with the patient today.  Concerns regarding medicines are outlined above.  No orders of the defined types were placed in this encounter.  Medication changes: No orders of the defined types were placed in this encounter.   Signed, 03/10/22, MD, [facility] 03/28/2022 8:40 AM    [facility]

## 2022-03-28 NOTE — Patient Instructions (Signed)
Medication Instructions:  Your physician recommends that you continue on your current medications as directed. Please refer to the Current Medication list given to you today.  *If you need a refill on your cardiac medications before your next appointment, please call your pharmacy*   Lab Work: NONE If you have labs (blood work) drawn today and your tests are completely normal, you will receive your results only by: MyChart Message (if you have MyChart) OR A paper copy in the mail If you have any lab test that is abnormal or we need to change your treatment, we will call you to review the results.   Testing/Procedures: NONE   Follow-Up: At [facility], you and your health needs are our priority.  As part of our continuing mission to provide you with exceptional heart care, we have created designated Provider Care Teams.  These Care Teams include your primary Cardiologist (physician) and Advanced Practice Providers (APPs -  Physician Assistants and Nurse Practitioners) who all work together to provide you with the care you need, when you need it.  We recommend signing up for the patient portal called "MyChart".  Sign up information is provided on this After Visit Summary.  MyChart is used to connect with patients for Virtual Visits (Telemedicine).  Patients are able to view lab/test results, encounter notes, upcoming appointments, etc.  Non-urgent messages can be sent to your provider as well.   To learn more about what you can do with MyChart, go to [email or web address].    Your next appointment:   6 month(s)  The format for your next appointment:   In Person  Provider:   Robert Krasowski, MD    Other Instructions   Important Information About Sugar       

## 2022-04-04 ENCOUNTER — Telehealth: Payer: Self-pay | Admitting: Cardiology

## 2022-04-04 DIAGNOSIS — I480 Paroxysmal atrial fibrillation: Secondary | ICD-10-CM

## 2022-04-04 MED ORDER — ELIQUIS 5 MG PO TABS: 5.0000 mg | ORAL_TABLET | Freq: Two times a day (BID) | ORAL | 1 refills | Status: DC

## 2022-04-04 NOTE — Telephone Encounter (Signed)
Prescription refill request for Eliquis received. Indication:Afib  Last office visit: 03/28/22 [name])  Scr:1.17 (2/29/23)  Age: 67 Weight: 85.7kg  Appropriate dose and refill sent to requested pharmacy.

## 2022-04-04 NOTE — Telephone Encounter (Signed)
*  STAT* If patient is at the pharmacy, call can be transferred to refill team.   1. Which medications need to be refilled? (please list name of each medication and dose if known)   ELIQUIS 5 MG TABS tablet    2. Which pharmacy/location (including street and city if local pharmacy) is medication to be sent to?   ELIQUIS 5 MG TABS tablet    3. Do they need a 30 day or 90 day supply?  90 day

## 2022-08-24 ENCOUNTER — Other Ambulatory Visit: Payer: Self-pay | Admitting: Cardiology

## 2022-08-24 DIAGNOSIS — I480 Paroxysmal atrial fibrillation: Secondary | ICD-10-CM

## 2022-08-24 NOTE — Telephone Encounter (Signed)
Prescription refill request for Eliquis received. Indication: Afib  Last office visit: 03/28/22 Bing Matter)  Scr: 1.14 (05/09/22)  Age: 67 Weight: 84.5kg  Appropriate dose. Refill sent.

## 2022-10-18 ENCOUNTER — Encounter: Payer: Self-pay | Admitting: Cardiology

## 2022-10-19 ENCOUNTER — Ambulatory Visit: Payer: Medicare PPO | Attending: Cardiology | Admitting: Cardiology

## 2022-10-19 ENCOUNTER — Encounter: Payer: Self-pay | Admitting: Cardiology

## 2022-10-19 VITALS — BP 110/70 | HR 67 | Ht 76.0 in | Wt 180.6 lb

## 2022-10-19 DIAGNOSIS — Q231 Congenital insufficiency of aortic valve: Secondary | ICD-10-CM

## 2022-10-19 DIAGNOSIS — I480 Paroxysmal atrial fibrillation: Secondary | ICD-10-CM | POA: Diagnosis not present

## 2022-10-19 NOTE — Patient Instructions (Addendum)
Medication Instructions:  Your physician recommends that you continue on your current medications as directed. Please refer to the Current Medication list given to you today.  *If you need a refill on your cardiac medications before your next appointment, please call your pharmacy*   Lab Work: None Ordered If you have labs (blood work) drawn today and your tests are completely normal, you will receive your results only by: MyChart Message (if you have MyChart) OR A paper copy in the mail If you have any lab test that is abnormal or we need to change your treatment, we will call you to review the results.   Testing/Procedures: Your physician has requested that you have an echocardiogram. Echocardiography is a painless test that uses sound waves to create images of your heart. It provides your doctor with information about the size and shape of your heart and how well your heart's chambers and valves are working. This procedure takes approximately one hour. There are no restrictions for this procedure. Please do NOT wear cologne, perfume, aftershave, or lotions (deodorant is allowed). Please arrive 15 minutes prior to your appointment time.    Follow-Up: At [facility], you and your health needs are our priority.  As part of our continuing mission to provide you with exceptional heart care, we have created designated Provider Care Teams.  These Care Teams include your primary Cardiologist (physician) and Advanced Practice Providers (APPs -  Physician Assistants and Nurse Practitioners) who all work together to provide you with the care you need, when you need it.  We recommend signing up for the patient portal called "MyChart".  Sign up information is provided on this After Visit Summary.  MyChart is used to connect with patients for Virtual Visits (Telemedicine).  Patients are able to view lab/test results, encounter notes, upcoming appointments, etc.  Non-urgent messages can be sent to your  provider as well.   To learn more about what you can do with MyChart, go to [email or web address].    Your next appointment:   12 month(s)  The format for your next appointment:   In Person  Provider:   Robert Krasowski, MD    Other Instructions NA  

## 2022-10-19 NOTE — Addendum Note (Signed)
Addended by: Baldo Ash D on: 10/19/2022 08:53 AM   Modules accepted: Orders

## 2022-10-19 NOTE — Progress Notes (Signed)
Cardiology Office Note:    Date:  10/19/2022   ID:  [name], DOB 06/24/1955, MRN [id number]  PCP:  Olive Bass, MD  Cardiologist:  Gypsy Balsam, MD    Referring MD: Olive Bass, MD   Chief Complaint  Patient presents with   Follow-up    History of Present Illness:    [name] is a 67 y.o. male with past medical history significant for paroxysmal atrial fibrillation, CHADS2 Vascor equals 3 he is anticoagulated.  Will continue rhythm control strategy which is achieved with flecainide 50 mg twice daily.  AV blockade is achieved with metoprolol.  Also essential hypertension borderline diabetes.  Comes today to months for follow-up overall doing very well asymptomatic very active he recently retired.  Exercise 5 times a week for half a mile on the treadmill 4 mph have no difficulty doing it.  Asking me to discontinue his metoprolol because sometimes his blood pressure gets low.  Past Medical History:  Diagnosis Date   Acute cystitis with hematuria 03/04/2019   Arthritis    neck , ankles   Atypical chest pain 02/24/2020   Bicuspid aortic valve    Bladder stone    BPH (benign prostatic hyperplasia)    Cervical disc disease    Gross hematuria 03/25/2018   Formatting of this note might be different from the original. 2019, 2020   Hematuria    History of kidney stones    Hypertension    Mixed hyperlipidemia    Nephrolithiasis    Nocturia    PAF (paroxysmal atrial fibrillation) (HCC) followed by pcp-- per pt never followed by cardiologist after 2017   per pt had AFib 2017,  had work-up stress test and echo done at [facility] at that time, no other work-up done since,    Prediabetes 03/21/2016   Formatting of this note might be different from the original. 2018: 135/6.0   Prostatic hyperplasia 01/15/2019   Screening for lipid disorders 03/21/2016   Second degree Mobitz I AV block 11/11/2019   Urine retention    Wears glasses    Wellness examination  03/21/2016    Past Surgical History:  Procedure Laterality Date   bladder cauterization  01/16/2019   COLONOSCOPY     x2   CYSTOSCOPY WITH LITHOLAPAXY N/A 01/15/2019   Procedure: CYSTOSCOPY WITH LITHOLAPAXY;  Surgeon: Sebastian Ache, MD;  Location[facility];  Service: Urology;  Laterality: N/A;   CYSTOSCOPY WITH URETHRAL DILATATION N/A 05/02/2019   Procedure: CYSTOSCOPY WITH URETHRAL DILATATION;  Surgeon: Sebastian Ache, MD;  Location: [facility];  Service: Urology;  Laterality: N/A;  45 MINS   CYSTOSTOMY W/ RETROGRADE URETEROSCOPY  1994   INGUINAL HERNIA REPAIR Right 2006  approx.   TRANSURETHRAL RESECTION OF PROSTATE N/A 01/15/2019   Procedure: TRANSURETHRAL RESECTION OF THE PROSTATE (TURP);  Surgeon: Sebastian Ache, MD;  Location: [facility];  Service: Urology;  Laterality: N/A;    Current Medications: Current Meds  Medication Sig   atorvastatin (LIPITOR) 10 MG tablet Take 5 mg by mouth every morning.    ELIQUIS 5 MG TABS tablet TAKE 1 TABLET BY MOUTH TWICE A DAY   flecainide (TAMBOCOR) 50 MG tablet Take 1 tablet (50 mg total) by mouth 2 (two) times daily.   indapamide (LOZOL) 1.25 MG tablet Take 1.25 mg by mouth daily.   metoprolol succinate (TOPROL XL) 25 MG 24 hr tablet Take 1 tablet (25 mg total) by mouth daily.  Allergies:   Ibuprofen   Social History   Socioeconomic History   Marital status: Married    Spouse name: Not on file   Number of children: Not on file   Years of education: Not on file   Highest education level: Not on file  Occupational History   Not on file  Tobacco Use   Smoking status: Never   Smokeless tobacco: Former    Types: Snuff, Dorna Bloom    Quit date: 01/13/1974  Vaping Use   Vaping status: Never Used  Substance and Sexual Activity   Alcohol use: Not Currently   Drug use: Never   Sexual activity: Not on file  Other Topics Concern   Not on file  Social History Narrative   Not on file    Social Determinants of Health   Financial Resource Strain: Not on file  Food Insecurity: Not on file  Transportation Needs: Not on file  Physical Activity: Not on file  Stress: Not on file  Social Connections: Not on file     Family History: The patient's family history includes Aortic aneurysm in his mother; Atrial fibrillation in his sister; Diabetes in his father; Hypertension in his mother; Nephrolithiasis in his father; Pancreatic cancer in his father. ROS:   Please see the history of present illness.    All 14 point review of systems negative except as described per history of present illness  EKGs/Labs/Other Studies Reviewed:    EKG Interpretation Date/Time:  Thursday October 19 2022 08:12:44 EDT Ventricular Rate:  68 PR Interval:  204 QRS Duration:  106 QT Interval:  424 QTC Calculation: 450 R Axis:   69  Text Interpretation: Normal sinus rhythm Nonspecific ST abnormality Abnormal ECG When compared with ECG of 15-Jan-2019 09:56, Premature ventricular complexes are no longer Present Confirmed by Gypsy Balsam [phone]) on 10/19/2022 8:28:27 AM    Recent Labs: No results found for requested labs within last 365 days.  Recent Lipid Panel No results found for: "CHOL", "TRIG", "HDL", "CHOLHDL", "VLDL", "LDLCALC", "LDLDIRECT"  Physical Exam:    VS:  BP 110/70 (BP Location: Left Arm, Patient Position: Sitting)   Pulse 67   Ht 6\' 4"  (1.93 m)   Wt 180 lb 9.6 oz (81.9 kg)   SpO2 95%   BMI 21.98 kg/m     Wt Readings from Last 3 Encounters:  10/19/22 180 lb 9.6 oz (81.9 kg)  03/28/22 189 lb (85.7 kg)  03/10/22 193 lb (87.5 kg)     GEN:  Well nourished, well developed in no acute distress HEENT: Normal NECK: No JVD; No carotid bruits LYMPHATICS: No lymphadenopathy CARDIAC: RRR, no murmurs, no rubs, no gallops RESPIRATORY:  Clear to auscultation without rales, wheezing or rhonchi  ABDOMEN: Soft, non-tender, non-distended MUSCULOSKELETAL:  No edema; No deformity   SKIN: Warm and dry LOWER EXTREMITIES: no swelling NEUROLOGIC:  Alert and oriented x 3 PSYCHIATRIC:  Normal affect   ASSESSMENT:    1. PAF (paroxysmal atrial fibrillation) (HCC)    PLAN:    In order of problems listed above:  Paroxysmal atrial fibrillation stable from that point review continue present management. Essential hypertension blood pressure actually is on the lower side.  Will discontinue his metoprolol however asking him to try to weigh and I told him if he still having palpitation him to go back on the medication. Questionable bicuspid aortic valve.  Will repeat his echocardiogram to recheck on that. Dyslipidemia I did review data from primary care physician of his cholesterol is excellent  we will continue present management   Medication Adjustments/Labs and Tests Ordered: Current medicines are reviewed at length with the patient today.  Concerns regarding medicines are outlined above.  Orders Placed This Encounter  Procedures   EKG 12-Lead   Medication changes: No orders of the defined types were placed in this encounter.   Signed, Georgeanna Lea, MD, [facility] 10/19/2022 8:43 AM    [facility]

## 2022-11-28 ENCOUNTER — Ambulatory Visit: Payer: Medicare PPO

## 2022-12-29 ENCOUNTER — Ambulatory Visit: Payer: Medicare PPO | Attending: Cardiology

## 2022-12-29 DIAGNOSIS — I351 Nonrheumatic aortic (valve) insufficiency: Secondary | ICD-10-CM

## 2022-12-29 DIAGNOSIS — Q2381 Bicuspid aortic valve: Secondary | ICD-10-CM

## 2022-12-29 DIAGNOSIS — I5030 Unspecified diastolic (congestive) heart failure: Secondary | ICD-10-CM | POA: Diagnosis not present

## 2022-12-30 LAB — ECHOCARDIOGRAM COMPLETE
Area-P 1/2: 3.24 cm2
P 1/2 time: 643 ms
S' Lateral: 2.8 cm

## 2023-01-03 LAB — LAB REPORT - SCANNED: EGFR: 60

## 2023-01-07 NOTE — Progress Notes (Unsigned)
Cardiology Office Note:  .   Date:  01/09/2023  ID:  [name], DOB 1956-01-16, MRN [id number] PCP: Olive Bass, MD  [facility] Providers Cardiologist:  None    History of Present Illness: .   [name] is a 67 y.o. male with a past medical history of PAF on flecainide  & Eliquis, hypertension, PFO, second-degree AV block type I, BPH, dyslipidemia.  Most recently evaluated by Dr. Bing Matter on 10/19/2022, he was doing well from a cardiac perspective, exercising most days of the week.  He was bothered by episodes of his blood pressure dropping low and his metoprolol was discontinued.  Repeat echocardiogram was arranged to determine if he had a bicuspid aortic valve.  He presented to the emergency department on 01/03/2023 with complaints of his heart racing.  Sodium 139, potassium 3.6, creatinine 1, GFR greater than 60, hemoglobin 16.3, hematocrit 48.5.  He presents today for follow-up of his atrial fibrillation and recent ED visit as outlined above.  He has not been bothered further by palpitations.  He was previously on metoprolol however this was stopped secondary to his heart rate dropping too low which was bothersome to him when he was working out.  He was started on Cardizem 60 mg 3 times daily. He denies chest pain, palpitations, dyspnea, pnd, orthopnea, n, v, dizziness, syncope, edema, weight gain, or early satiety.   ROS: Review of Systems  All other systems reviewed and are negative.    Studies Reviewed: .        Cardiac Studies & Procedures     STRESS TESTS  EXERCISE TOLERANCE TEST (ETT) 11/04/2019  Narrative  Blood pressure demonstrated a normal response to exercise.  Horizontal ST segment depression ST segment depression was noted during stress in the III leads.  Positive stress study given 2 mm horizontal ST depression in lead 3 at peak exercise and persisting into recovery.   ECHOCARDIOGRAM  ECHOCARDIOGRAM COMPLETE  12/29/2022  Narrative ECHOCARDIOGRAM REPORT    Patient Name:   [name] Date of Exam: 12/29/2022 Medical Rec #:  [id number]           Height:       76.0 in Accession #:    [id number]          Weight:       180.6 lb Date of Birth:  [birth year]            BSA:          2.121 m Patient Age:    67 years            BP:           110/70 mmHg Patient Gender: M                   HR:           61 bpm. Exam Location:    Procedure: 2D Echo, Cardiac Doppler, Color Doppler, Strain Analysis and 3D Echo  Indications:    Bicuspid aortic valve [Q23.81 (ICD-10-CM)]  History:        Patient has prior history of Echocardiogram examinations, most recent 01/07/2021. Arrythmias:Atrial Fibrillation; Risk Factors:Hypertension, Dyslipidemia and Former Smoker.  Sonographer:    Margreta Journey RDCS Referring Phys: [id number] ROBERT J KRASOWSKI  IMPRESSIONS   1. Left ventricular ejection fraction, by estimation, is 60 to 65%. The left ventricle has normal function. The left ventricle has no regional wall motion abnormalities. Left ventricular diastolic parameters are consistent  RIGHT ATRIUM           Index LA diam:        3.90 cm 1.84 cm/m   RA Area:     15.10 cm LA Vol (A2C):   45.5 ml 21.45 ml/m  RA Volume:   32.30 ml  15.23 ml/m LA Vol (A4C):   30.2 ml 14.24 ml/m LA Biplane Vol: 41.0 ml 19.33 ml/m AORTIC VALVE LVOT Vmax:   97.40 cm/s LVOT Vmean:  69.100 cm/s LVOT VTI:    0.213 m AI PHT:      643 msec  AORTA Ao Root diam: 3.80 cm Ao Asc diam:  3.90 cm Ao Desc diam: 2.20 cm  MITRAL VALVE               TRICUSPID VALVE MV Area (PHT): 3.24 cm    TR Peak grad:   17.3 mmHg MV Decel Time: 234 msec    TR Vmax:        208.00 cm/s MV E velocity: 46.60 cm/s MV A velocity: 48.20 cm/s  SHUNTS MV E/A ratio:  0.97        Systemic VTI:  0.21 m Systemic Diam: 2.10 cm  Belva Crome MD Electronically signed by Belva Crome MD Signature Date/Time: 12/30/2022/1:11:00 PM    Final    MONITORS  LONG TERM MONITOR (3-14 DAYS) 12/18/2019  Narrative [name], DOB 1956-01-16, MRN [id number]  HOLTER MONITOR REPORT:    Date of test:                 11/30/2019 Duration of test:           7 days Indication:                    Paroxysmal atrial fibrillation Ordering physician:  Georgeanna Lea, MD Referring physician:  Georgeanna Lea, MD   Baseline rhythm: Sinus  Minimum heart rate: 51 BPM.  Average heart rate: 81 BPM.  Maximal heart rate 168 BPM.  Atrial arrhythmia: Rare PACs noted, 3 episodes supraventricular tachycardia, fastest episode 12 beats at rate of 182 bpm, fastest episode was also the longest episode.  Ventricular arrhythmia: Rare PVCs noted with couplets and triplets  Conduction abnormality: None  Symptoms: None   Conclusion: Episode supraventricular tachycardia with  details as described above  Interpreting  cardiologist: Gypsy Balsam, MD Date: 12/27/2019 11:37 AM   CT SCANS  CT CORONARY MORPH W/CTA COR W/SCORE 03/23/2020  Addendum 03/23/2020  4:35 PM ADDENDUM REPORT: 03/23/2020 16:32  EXAM: Cardiac/Coronary  CT  TECHNIQUE: The patient was scanned on a [employer].  FINDINGS: A 120 kV prospective scan was triggered in the descending thoracic aorta at 111 HU's. Axial non-contrast 3 mm slices were carried out through the heart. The data set was analyzed on a dedicated work station and scored using the Agatson method. Gantry rotation speed was 250 msecs and collimation was .6 mm. No beta blockade and 0.8 mg of sl NTG was given. The 3D data set was reconstructed in 5% intervals of the 67-82 % of the R-R cycle. Diastolic phases were analyzed on a dedicated work station using MPR, MIP and VRT modes. The patient received 80 cc of contrast.  Aorta:  Normal size.  No calcifications.  No dissection.  Aortic Valve:  Trileaflet.  No calcifications.  Coronary Arteries:  Normal coronary origin.  Right dominance.  RCA is a large dominant artery that gives rise to PDA and PLVB. There is no plaque.  Cardiology Office Note:  .   Date:  01/09/2023  ID:  [name], DOB 1956-01-16, MRN [id number] PCP: Olive Bass, MD  [facility] Providers Cardiologist:  None    History of Present Illness: .   [name] is a 67 y.o. male with a past medical history of PAF on flecainide  & Eliquis, hypertension, PFO, second-degree AV block type I, BPH, dyslipidemia.  Most recently evaluated by Dr. Bing Matter on 10/19/2022, he was doing well from a cardiac perspective, exercising most days of the week.  He was bothered by episodes of his blood pressure dropping low and his metoprolol was discontinued.  Repeat echocardiogram was arranged to determine if he had a bicuspid aortic valve.  He presented to the emergency department on 01/03/2023 with complaints of his heart racing.  Sodium 139, potassium 3.6, creatinine 1, GFR greater than 60, hemoglobin 16.3, hematocrit 48.5.  He presents today for follow-up of his atrial fibrillation and recent ED visit as outlined above.  He has not been bothered further by palpitations.  He was previously on metoprolol however this was stopped secondary to his heart rate dropping too low which was bothersome to him when he was working out.  He was started on Cardizem 60 mg 3 times daily. He denies chest pain, palpitations, dyspnea, pnd, orthopnea, n, v, dizziness, syncope, edema, weight gain, or early satiety.   ROS: Review of Systems  All other systems reviewed and are negative.    Studies Reviewed: .        Cardiac Studies & Procedures     STRESS TESTS  EXERCISE TOLERANCE TEST (ETT) 11/04/2019  Narrative  Blood pressure demonstrated a normal response to exercise.  Horizontal ST segment depression ST segment depression was noted during stress in the III leads.  Positive stress study given 2 mm horizontal ST depression in lead 3 at peak exercise and persisting into recovery.   ECHOCARDIOGRAM  ECHOCARDIOGRAM COMPLETE  12/29/2022  Narrative ECHOCARDIOGRAM REPORT    Patient Name:   [name] Date of Exam: 12/29/2022 Medical Rec #:  [id number]           Height:       76.0 in Accession #:    [id number]          Weight:       180.6 lb Date of Birth:  [birth year]            BSA:          2.121 m Patient Age:    67 years            BP:           110/70 mmHg Patient Gender: M                   HR:           61 bpm. Exam Location:    Procedure: 2D Echo, Cardiac Doppler, Color Doppler, Strain Analysis and 3D Echo  Indications:    Bicuspid aortic valve [Q23.81 (ICD-10-CM)]  History:        Patient has prior history of Echocardiogram examinations, most recent 01/07/2021. Arrythmias:Atrial Fibrillation; Risk Factors:Hypertension, Dyslipidemia and Former Smoker.  Sonographer:    Margreta Journey RDCS Referring Phys: [id number] ROBERT J KRASOWSKI  IMPRESSIONS   1. Left ventricular ejection fraction, by estimation, is 60 to 65%. The left ventricle has normal function. The left ventricle has no regional wall motion abnormalities. Left ventricular diastolic parameters are consistent  RIGHT ATRIUM           Index LA diam:        3.90 cm 1.84 cm/m   RA Area:     15.10 cm LA Vol (A2C):   45.5 ml 21.45 ml/m  RA Volume:   32.30 ml  15.23 ml/m LA Vol (A4C):   30.2 ml 14.24 ml/m LA Biplane Vol: 41.0 ml 19.33 ml/m AORTIC VALVE LVOT Vmax:   97.40 cm/s LVOT Vmean:  69.100 cm/s LVOT VTI:    0.213 m AI PHT:      643 msec  AORTA Ao Root diam: 3.80 cm Ao Asc diam:  3.90 cm Ao Desc diam: 2.20 cm  MITRAL VALVE               TRICUSPID VALVE MV Area (PHT): 3.24 cm    TR Peak grad:   17.3 mmHg MV Decel Time: 234 msec    TR Vmax:        208.00 cm/s MV E velocity: 46.60 cm/s MV A velocity: 48.20 cm/s  SHUNTS MV E/A ratio:  0.97        Systemic VTI:  0.21 m Systemic Diam: 2.10 cm  Belva Crome MD Electronically signed by Belva Crome MD Signature Date/Time: 12/30/2022/1:11:00 PM    Final    MONITORS  LONG TERM MONITOR (3-14 DAYS) 12/18/2019  Narrative [name], DOB 1956-01-16, MRN [id number]  HOLTER MONITOR REPORT:    Date of test:                 11/30/2019 Duration of test:           7 days Indication:                    Paroxysmal atrial fibrillation Ordering physician:  Georgeanna Lea, MD Referring physician:  Georgeanna Lea, MD   Baseline rhythm: Sinus  Minimum heart rate: 51 BPM.  Average heart rate: 81 BPM.  Maximal heart rate 168 BPM.  Atrial arrhythmia: Rare PACs noted, 3 episodes supraventricular tachycardia, fastest episode 12 beats at rate of 182 bpm, fastest episode was also the longest episode.  Ventricular arrhythmia: Rare PVCs noted with couplets and triplets  Conduction abnormality: None  Symptoms: None   Conclusion: Episode supraventricular tachycardia with  details as described above  Interpreting  cardiologist: Gypsy Balsam, MD Date: 12/27/2019 11:37 AM   CT SCANS  CT CORONARY MORPH W/CTA COR W/SCORE 03/23/2020  Addendum 03/23/2020  4:35 PM ADDENDUM REPORT: 03/23/2020 16:32  EXAM: Cardiac/Coronary  CT  TECHNIQUE: The patient was scanned on a [employer].  FINDINGS: A 120 kV prospective scan was triggered in the descending thoracic aorta at 111 HU's. Axial non-contrast 3 mm slices were carried out through the heart. The data set was analyzed on a dedicated work station and scored using the Agatson method. Gantry rotation speed was 250 msecs and collimation was .6 mm. No beta blockade and 0.8 mg of sl NTG was given. The 3D data set was reconstructed in 5% intervals of the 67-82 % of the R-R cycle. Diastolic phases were analyzed on a dedicated work station using MPR, MIP and VRT modes. The patient received 80 cc of contrast.  Aorta:  Normal size.  No calcifications.  No dissection.  Aortic Valve:  Trileaflet.  No calcifications.  Coronary Arteries:  Normal coronary origin.  Right dominance.  RCA is a large dominant artery that gives rise to PDA and PLVB. There is no plaque.  Cardiology Office Note:  .   Date:  01/09/2023  ID:  [name], DOB 1956-01-16, MRN [id number] PCP: Olive Bass, MD  [facility] Providers Cardiologist:  None    History of Present Illness: .   [name] is a 67 y.o. male with a past medical history of PAF on flecainide  & Eliquis, hypertension, PFO, second-degree AV block type I, BPH, dyslipidemia.  Most recently evaluated by Dr. Bing Matter on 10/19/2022, he was doing well from a cardiac perspective, exercising most days of the week.  He was bothered by episodes of his blood pressure dropping low and his metoprolol was discontinued.  Repeat echocardiogram was arranged to determine if he had a bicuspid aortic valve.  He presented to the emergency department on 01/03/2023 with complaints of his heart racing.  Sodium 139, potassium 3.6, creatinine 1, GFR greater than 60, hemoglobin 16.3, hematocrit 48.5.  He presents today for follow-up of his atrial fibrillation and recent ED visit as outlined above.  He has not been bothered further by palpitations.  He was previously on metoprolol however this was stopped secondary to his heart rate dropping too low which was bothersome to him when he was working out.  He was started on Cardizem 60 mg 3 times daily. He denies chest pain, palpitations, dyspnea, pnd, orthopnea, n, v, dizziness, syncope, edema, weight gain, or early satiety.   ROS: Review of Systems  All other systems reviewed and are negative.    Studies Reviewed: .        Cardiac Studies & Procedures     STRESS TESTS  EXERCISE TOLERANCE TEST (ETT) 11/04/2019  Narrative  Blood pressure demonstrated a normal response to exercise.  Horizontal ST segment depression ST segment depression was noted during stress in the III leads.  Positive stress study given 2 mm horizontal ST depression in lead 3 at peak exercise and persisting into recovery.   ECHOCARDIOGRAM  ECHOCARDIOGRAM COMPLETE  12/29/2022  Narrative ECHOCARDIOGRAM REPORT    Patient Name:   [name] Date of Exam: 12/29/2022 Medical Rec #:  [id number]           Height:       76.0 in Accession #:    [id number]          Weight:       180.6 lb Date of Birth:  [birth year]            BSA:          2.121 m Patient Age:    67 years            BP:           110/70 mmHg Patient Gender: M                   HR:           61 bpm. Exam Location:    Procedure: 2D Echo, Cardiac Doppler, Color Doppler, Strain Analysis and 3D Echo  Indications:    Bicuspid aortic valve [Q23.81 (ICD-10-CM)]  History:        Patient has prior history of Echocardiogram examinations, most recent 01/07/2021. Arrythmias:Atrial Fibrillation; Risk Factors:Hypertension, Dyslipidemia and Former Smoker.  Sonographer:    Margreta Journey RDCS Referring Phys: [id number] ROBERT J KRASOWSKI  IMPRESSIONS   1. Left ventricular ejection fraction, by estimation, is 60 to 65%. The left ventricle has normal function. The left ventricle has no regional wall motion abnormalities. Left ventricular diastolic parameters are consistent

## 2023-01-09 ENCOUNTER — Ambulatory Visit: Payer: Medicare PPO | Attending: Cardiology | Admitting: Cardiology

## 2023-01-09 ENCOUNTER — Encounter: Payer: Self-pay | Admitting: Cardiology

## 2023-01-09 VITALS — BP 128/68 | HR 79 | Ht 76.0 in | Wt 185.0 lb

## 2023-01-09 DIAGNOSIS — D6859 Other primary thrombophilia: Secondary | ICD-10-CM

## 2023-01-09 DIAGNOSIS — I480 Paroxysmal atrial fibrillation: Secondary | ICD-10-CM

## 2023-01-09 DIAGNOSIS — E782 Mixed hyperlipidemia: Secondary | ICD-10-CM

## 2023-01-09 DIAGNOSIS — I351 Nonrheumatic aortic (valve) insufficiency: Secondary | ICD-10-CM

## 2023-01-09 DIAGNOSIS — Q2381 Bicuspid aortic valve: Secondary | ICD-10-CM

## 2023-01-09 DIAGNOSIS — I10 Essential (primary) hypertension: Secondary | ICD-10-CM | POA: Diagnosis not present

## 2023-01-09 MED ORDER — DILTIAZEM HCL ER COATED BEADS 120 MG PO CP24: 120.0000 mg | ORAL_CAPSULE | Freq: Every day | ORAL | 3 refills | Status: DC

## 2023-01-09 NOTE — Patient Instructions (Signed)
Medication Instructions:   USE: Short acting Cardizem 60mg  as needed for heart rate of 120 or greater  START: Cardizem CD 120mg  1 tablet daily   Lab Work: None Ordered If you have labs (blood work) drawn today and your tests are completely normal, you will receive your results only by: [employer] (if you have MyChart) OR A paper copy in the mail If you have any lab test that is abnormal or we need to change your treatment, we will call you to review the results.   Testing/Procedures: None Ordered   Follow-Up: At [facility], you and your health needs are our priority.  As part of our continuing mission to provide you with exceptional heart care, we have created designated Provider Care Teams.  These Care Teams include your primary Cardiologist (physician) and Advanced Practice Providers (APPs -  Physician Assistants and Nurse Practitioners) who all work together to provide you with the care you need, when you need it.  We recommend signing up for the patient portal called "MyChart".  Sign up information is provided on this After Visit Summary.  MyChart is used to connect with patients for Virtual Visits (Telemedicine).  Patients are able to view lab/test results, encounter notes, upcoming appointments, etc.  Non-urgent messages can be sent to your provider as well.   To learn more about what you can do with MyChart, go to [email or web address].    Your next appointment:   Keep Appt with Dr. Bing Matter  The format for your next appointment:   In Person  Provider:   Festus Barren   Other Instructions NA

## 2023-01-10 ENCOUNTER — Telehealth: Payer: Self-pay

## 2023-01-10 NOTE — Telephone Encounter (Signed)
Patient notified of results and verbalized understanding.  

## 2023-01-10 NOTE — Telephone Encounter (Signed)
-----   Message from Gypsy Balsam sent at 01/08/2023 12:09 PM EDT ----- Echocardiogram showed preserved left ventricle ejection fraction, moderate aortic insufficiency, continue medical therapy

## 2023-02-06 ENCOUNTER — Other Ambulatory Visit: Payer: Self-pay | Admitting: Cardiology

## 2023-02-06 DIAGNOSIS — I480 Paroxysmal atrial fibrillation: Secondary | ICD-10-CM

## 2023-04-14 ENCOUNTER — Other Ambulatory Visit: Payer: Self-pay | Admitting: Cardiology

## 2023-04-14 DIAGNOSIS — I480 Paroxysmal atrial fibrillation: Secondary | ICD-10-CM

## 2023-04-16 NOTE — Telephone Encounter (Signed)
Prescription refill request for Eliquis received. Indication:afib Last office visit:10/24 Scr:1.00  10/24 Age: 68 Weight:83.9  kg  Prescription refilled

## 2023-05-11 DIAGNOSIS — L603 Nail dystrophy: Secondary | ICD-10-CM | POA: Insufficient documentation

## 2023-05-11 DIAGNOSIS — R052 Subacute cough: Secondary | ICD-10-CM | POA: Insufficient documentation

## 2023-10-11 ENCOUNTER — Other Ambulatory Visit: Payer: Self-pay | Admitting: Cardiology

## 2023-10-11 DIAGNOSIS — I480 Paroxysmal atrial fibrillation: Secondary | ICD-10-CM

## 2023-10-11 NOTE — Telephone Encounter (Signed)
 Prescription refill request for Eliquis  received. Indication: Afib  Last office visit: 01/09/23 A[place])  Scr: 1.00 (01/03/23)  Age: 68 Weight: 83.9kg  Appropriate dose. Refill sent.

## 2023-11-08 ENCOUNTER — Encounter: Payer: Self-pay | Admitting: *Deleted

## 2023-11-09 ENCOUNTER — Ambulatory Visit: Attending: Cardiology | Admitting: Cardiology

## 2023-11-09 ENCOUNTER — Encounter: Payer: Self-pay | Admitting: Cardiology

## 2023-11-09 VITALS — BP 110/80 | HR 70 | Ht 76.0 in | Wt 183.0 lb

## 2023-11-09 DIAGNOSIS — I480 Paroxysmal atrial fibrillation: Secondary | ICD-10-CM

## 2023-11-09 DIAGNOSIS — Q2381 Bicuspid aortic valve: Secondary | ICD-10-CM | POA: Diagnosis not present

## 2023-11-09 DIAGNOSIS — I10 Essential (primary) hypertension: Secondary | ICD-10-CM | POA: Diagnosis not present

## 2023-11-09 DIAGNOSIS — I351 Nonrheumatic aortic (valve) insufficiency: Secondary | ICD-10-CM | POA: Diagnosis not present

## 2023-11-09 DIAGNOSIS — R0609 Other forms of dyspnea: Secondary | ICD-10-CM

## 2023-11-09 NOTE — Patient Instructions (Addendum)
 Medication Instructions:  Your physician recommends that you continue on your current medications as directed. Please refer to the Current Medication list given to you today.  *If you need a refill on your cardiac medications before your next appointment, please call your pharmacy*   Lab Work: None Ordered If you have labs (blood work) drawn today and your tests are completely normal, you will receive your results only by: MyChart Message (if you have MyChart) OR A paper copy in the mail If you have any lab test that is abnormal or we need to change your treatment, we will call you to review the results.   Testing/Procedures: October Your physician has requested that you have an echocardiogram. Echocardiography is a painless test that uses sound waves to create images of your heart. It provides your doctor with information about the size and shape of your heart and how well your heart's chambers and valves are working. This procedure takes approximately one hour. There are no restrictions for this procedure. Please do NOT wear cologne, perfume, aftershave, or lotions (deodorant is allowed). Please arrive 15 minutes prior to your appointment time.  Please note: We ask at that you not bring children with you during ultrasound (echo/ vascular) testing. Due to room size and safety concerns, children are not allowed in the ultrasound rooms during exams. Our front office staff cannot provide observation of children in our lobby area while testing is being conducted. An adult accompanying a patient to their appointment will only be allowed in the ultrasound room at the discretion of the ultrasound technician under special circumstances. We apologize for any inconvenience.    Follow-Up: At [facility], you and your health needs are our priority.  As part of our continuing mission to provide you with exceptional heart care, we have created designated Provider Care Teams.  These Care Teams include  your primary Cardiologist (physician) and Advanced Practice Providers (APPs -  Physician Assistants and Nurse Practitioners) who all work together to provide you with the care you need, when you need it.  We recommend signing up for the patient portal called MyChart.  Sign up information is provided on this After Visit Summary.  MyChart is used to connect with patients for Virtual Visits (Telemedicine).  Patients are able to view lab/test results, encounter notes, upcoming appointments, etc.  Non-urgent messages can be sent to your provider as well.   To learn more about what you can do with MyChart, go to [email or web address].    Your next appointment:   6 month(s)  The format for your next appointment:   In Person  Provider:   Lamar Fitch, MD    Other Instructions NA

## 2023-11-09 NOTE — Progress Notes (Signed)
 Cardiology Office Note:    Date:  11/09/2023   ID:  [name], DOB March 22, 1955, MRN [id number]  PCP:  Ofilia Lamar CROME, MD  Cardiologist:  Lamar Fitch, MD    Referring MD: Ofilia Lamar CROME, MD   Chief Complaint  Patient presents with   Follow-up    History of Present Illness:    [name] is a 68 y.o. male past medical history significant for paroxysmal atrial fibrillation, CHA2DS2-VASc equals 3, he is anticoagulated continue rhythm control strategy with flecainide , AV blockade is achieved now with calcium  channel blocker.  Also essential hypertension, borderline diabetes.  Comes today to months for follow-up.  Overall doing great.  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  He have no difficulty doing it he is enjoying hard-working no problems no palpitations no dizziness no passing out no chest pain tightness squeezing pressure burning chest  Past Medical History:  Diagnosis Date   Acute cystitis with hematuria 03/04/2019   Arthritis    neck , ankles   Atypical chest pain 02/24/2020   Bicuspid aortic valve    Bladder stone    BPH (benign prostatic hyperplasia)    Cervical disc disease    Gross hematuria 03/25/2018   Formatting of this note might be different from the original. 2019, 2020   Hematuria    History of kidney stones    Hypertension    Mixed hyperlipidemia    Nephrolithiasis    Nocturia    PAF (paroxysmal atrial fibrillation) (HCC) followed by pcp-- per pt never followed by cardiologist after 2017   per pt had AFib 2017,  had work-up stress test and echo done at [facility] at that time, no other work-up done since,    PFO (patent foramen ovale) 2022   Prediabetes 03/21/2016   Formatting of this note might be different from the original. 2018: 135/6.0   Prostatic hyperplasia 01/15/2019   Screening for  lipid disorders 03/21/2016   Second degree Mobitz I AV block 11/11/2019   Urine retention    Wears glasses    Wellness examination 03/21/2016    Past Surgical History:  Procedure Laterality Date   bladder cauterization  01/16/2019   COLONOSCOPY     x2   CYSTOSCOPY WITH LITHOLAPAXY N/A 01/15/2019   Procedure: CYSTOSCOPY WITH LITHOLAPAXY;  Surgeon: Alvaro Hummer, MD;  Location: [facility];  Service: Urology;  Laterality: N/A;   CYSTOSCOPY WITH URETHRAL DILATATION N/A 05/02/2019   Procedure: CYSTOSCOPY WITH URETHRAL DILATATION;  Surgeon: Alvaro Hummer, MD;  Location: [facility];  Service: Urology;  Laterality: N/A;  45 MINS   CYSTOSTOMY W/ RETROGRADE URETEROSCOPY  1994   INGUINAL HERNIA REPAIR Right 2006  approx.   TRANSURETHRAL RESECTION OF PROSTATE N/A 01/15/2019   Procedure: TRANSURETHRAL RESECTION OF THE PROSTATE (TURP);  Surgeon: Alvaro Hummer, MD;  Location: [facility];  Service: Urology;  Laterality: N/A;    Current Medications: Current Meds  Medication Sig   atorvastatin  (LIPITOR) 10 MG tablet Take 5 mg by mouth every morning.    DILT-XR 120 MG 24 hr capsule Take 120 mg by mouth daily.   diltiazem  (CARDIZEM  CD) 120 MG 24 hr capsule Take 1 capsule (120 mg total) by mouth daily.   ELIQUIS  5 MG TABS tablet TAKE 1 TABLET BY MOUTH TWICE A  DAY   flecainide  (TAMBOCOR ) 50 MG tablet TAKE 1 TABLET BY MOUTH TWICE A DAY   indapamide  (LOZOL ) 1.25 MG tablet Take 1.25 mg by mouth daily.     Allergies:   Ibuprofen   Social History   Socioeconomic History   Marital status: Married    Spouse name: Not on file   Number of children: Not on file   Years of education: Not on file   Highest education level: Not on file  Occupational History   Not on file  Tobacco Use   Smoking status: Never   Smokeless tobacco: Former    Types: Snuff, [place]    Quit date: 01/13/1974  Vaping Use   Vaping status: Never Used  Substance and Sexual  Activity   Alcohol use: Not Currently   Drug use: Never   Sexual activity: Not on file  Other Topics Concern   Not on file  Social History Narrative   Not on file   Social Drivers of Health   Financial Resource Strain: Not on file  Food Insecurity: Low Risk  (05/11/2023)   Received from [facility]   Hunger Vital Sign    Within the past 12 months, you worried that your food would run out before you got money to buy more: Never true    Within the past 12 months, the food you bought just didn't last and you didn't have money to get more. : Never true  Transportation Needs: No Transportation Needs (05/11/2023)   Received from [facility]    In the past 12 months, has lack of reliable transportation kept you from medical appointments, meetings, work or from getting things needed for daily living? : No  Physical Activity: Not on file  Stress: Not on file  Social Connections: Not on file     Family History: The patient's family history includes Aortic aneurysm in his mother; Atrial fibrillation in his sister; Diabetes in his father; Hypertension in his mother; Nephrolithiasis in his father; Pancreatic cancer in his father. ROS:   Please see the history of present illness.    All 14 point review of systems negative except as described per history of present illness  EKGs/Labs/Other Studies Reviewed:    EKG Interpretation Date/Time:  Friday November 09 2023 08:26:43 EDT Ventricular Rate:  70 PR Interval:  200 QRS Duration:  104 QT Interval:  422 QTC Calculation: 455 R Axis:   78  Text Interpretation: Normal sinus rhythm Normal ECG When compared with ECG of 19-Oct-2022 08:12, No significant change was found Confirmed by Bernie Charleston [phone]) on 11/09/2023 8:46:58 AM    Recent Labs: No results found for requested labs within last 365 days.  Recent Lipid Panel No results found for: CHOL, TRIG, HDL, CHOLHDL, VLDL, LDLCALC, LDLDIRECT  Physical  Exam:    VS:  BP 110/80   Pulse 70   Ht 6' 4 (1.93 m)   Wt 183 lb (83 kg)   SpO2 93%   BMI 22.28 kg/m     Wt Readings from Last 3 Encounters:  11/09/23 183 lb (83 kg)  01/09/23 185 lb (83.9 kg)  10/19/22 180 lb 9.6 oz (81.9 kg)     GEN:  Well nourished, well developed in no acute distress HEENT: Normal NECK: No JVD; No carotid bruits LYMPHATICS: No lymphadenopathy CARDIAC: RRR, systolic murmur grade 2/6 basilar border sternum, no rubs, no gallops RESPIRATORY:  Clear to auscultation without rales, wheezing or rhonchi  ABDOMEN: Soft, non-tender, non-distended MUSCULOSKELETAL:  No edema; No deformity  SKIN: Warm and dry LOWER EXTREMITIES: no swelling NEUROLOGIC:  Alert and oriented x 3 PSYCHIATRIC:  Normal affect   ASSESSMENT:    1. PAF (paroxysmal atrial fibrillation) (HCC)   2. Primary hypertension   3. Bicuspid aortic valve   4. Nonrheumatic aortic valve insufficiency    PLAN:    In order of problems listed above:  Paroxysmal atrial fibrillation, denies having a palpitation, rhythm control strategy which we will continue.  Continue anticoagulation. Essential hypertension blood pressure well-controlled continue present management. Bicuspid arctic valve questionable diagnosis.  Will repeat echocardiogram in October to look at the degree of aortic insufficiency will try to assess valve again.  I would anticipate severe problem by device by his age if it is truly bicuspid but again echocardiogram will be done. High risk medication use.  He is taking flecainide .  Continue no dizziness no passing out stable   Medication Adjustments/Labs and Tests Ordered: Current medicines are reviewed at length with the patient today.  Concerns regarding medicines are outlined above.  Orders Placed This Encounter  Procedures   EKG 12-Lead   Medication changes: No orders of the defined types were placed in this encounter.   Signed, Lamar DOROTHA Fitch, MD, [facility] 11/09/2023 8:57 AM     [facility]

## 2023-11-10 ENCOUNTER — Other Ambulatory Visit: Payer: Self-pay | Admitting: Cardiology

## 2023-12-06 LAB — LAB REPORT - SCANNED: EGFR: 73

## 2023-12-07 ENCOUNTER — Ambulatory Visit

## 2023-12-07 ENCOUNTER — Telehealth: Payer: Self-pay | Admitting: Cardiology

## 2023-12-07 VITALS — BP 110/72 | HR 75 | Resp 20 | Wt 188.0 lb

## 2023-12-07 DIAGNOSIS — I480 Paroxysmal atrial fibrillation: Secondary | ICD-10-CM

## 2023-12-07 NOTE — Telephone Encounter (Signed)
 Pt called in stating he was in [place] ED last night with afib. He was instructed to call Dr. Bernie today and ask for any recommendations. Please advise

## 2023-12-07 NOTE — Telephone Encounter (Signed)
 Nurse visit scheduled as requested per Dr. Monetta.

## 2023-12-08 ENCOUNTER — Ambulatory Visit: Payer: Self-pay

## 2023-12-13 NOTE — Progress Notes (Signed)
   Nurse Visit   Date of Encounter: 12/13/2023 ID: CAYLIN RABY, DOB December 14, 1955, MRN [id number]  PCP:  Ofilia Lamar CROME, MD   [facility] Providers Cardiologist:  None      Visit Details   VS:  BP 110/72 (BP Location: Left Arm, Patient Position: Sitting, Cuff Size: Normal)   Pulse 75   Resp 20   Wt 188 lb (85.3 kg)   SpO2 98%   BMI 22.88 kg/m  , BMI Body mass index is 22.88 kg/m.  Wt Readings from Last 3 Encounters:  12/07/23 188 lb (85.3 kg)  11/09/23 183 lb (83 kg)  01/09/23 185 lb (83.9 kg)     Reason for visit: Perform EKG Performed today: EKG, Education, Vitals and Provider consulted Changes (medications, testing, etc.) : No new orders at this time. Length of Visit: 25 minutes    Medications Adjustments/Labs and Tests Ordered: Orders Placed This Encounter  Procedures   EKG 12-Lead   No orders of the defined types were placed in this encounter.    Signed, Charlie LILLETTE Free, RN  12/13/2023 9:30 AM

## 2023-12-25 ENCOUNTER — Ambulatory Visit: Attending: Cardiology

## 2023-12-25 DIAGNOSIS — R0609 Other forms of dyspnea: Secondary | ICD-10-CM | POA: Diagnosis not present

## 2023-12-25 LAB — ECHOCARDIOGRAM COMPLETE
AR max vel: 3.18 cm2
AV Area VTI: 3.17 cm2
AV Area mean vel: 2.99 cm2
AV Mean grad: 2.3 mmHg
AV Peak grad: 4 mmHg
AV Vena cont: 0.9 cm
Ao pk vel: 1 m/s
Area-P 1/2: 3.02 cm2
MV VTI: 1.64 cm2
P 1/2 time: 631 ms
S' Lateral: 3.5 cm

## 2024-01-02 ENCOUNTER — Ambulatory Visit: Payer: Self-pay | Admitting: Cardiology

## 2024-01-09 ENCOUNTER — Telehealth: Payer: Self-pay

## 2024-01-09 NOTE — Telephone Encounter (Signed)
 Left message on My Chart with Echo results per Dr. Karry note. Routed to PCP.

## 2024-01-14 ENCOUNTER — Telehealth: Payer: Self-pay

## 2024-01-14 NOTE — Telephone Encounter (Signed)
 Pt viewed Echo results on My Chart per Dr. Vanetta Shawl note. Routed to PCP.

## 2024-01-30 ENCOUNTER — Other Ambulatory Visit: Payer: Self-pay | Admitting: Cardiology

## 2024-01-30 DIAGNOSIS — I480 Paroxysmal atrial fibrillation: Secondary | ICD-10-CM

## 2024-04-16 ENCOUNTER — Other Ambulatory Visit: Payer: Self-pay

## 2024-04-16 ENCOUNTER — Telehealth: Payer: Self-pay | Admitting: *Deleted

## 2024-04-16 DIAGNOSIS — I480 Paroxysmal atrial fibrillation: Secondary | ICD-10-CM

## 2024-04-16 MED ORDER — APIXABAN 5 MG PO TABS: 5.0000 mg | ORAL_TABLET | Freq: Two times a day (BID) | ORAL | 2 refills | Status: AC

## 2024-04-16 NOTE — Telephone Encounter (Signed)
 Eliquis  5mg  refill request received. Patient is 69 years old, weight-85.3kg, Crea-1.10 on 12/06/23 via scanned labs from [facility], Diagnosis-Afib, and last seen by Dr. Bernie on 11/09/23. Dose is appropriate based on dosing criteria. Will send in refill to requested pharmacy.

## 2024-04-17 NOTE — Telephone Encounter (Signed)
 Rx refill sent to pharmacy.
